# Patient Record
Sex: Male | Born: 1945 | Race: White | Hispanic: No | Marital: Married | Smoking: Never smoker
Health system: Southern US, Community
[De-identification: ages and names within clinical notes are randomized; demographics above are authoritative.]

## PROBLEM LIST (undated history)

## (undated) DIAGNOSIS — R110 Nausea: Secondary | ICD-10-CM

## (undated) DIAGNOSIS — I351 Nonrheumatic aortic (valve) insufficiency: Secondary | ICD-10-CM

## (undated) DIAGNOSIS — I7100 Dissection of unspecified site of aorta: Secondary | ICD-10-CM

## (undated) DIAGNOSIS — I7121 Aneurysm of the ascending aorta, without rupture: Secondary | ICD-10-CM

## (undated) DIAGNOSIS — I10 Essential (primary) hypertension: Secondary | ICD-10-CM

## (undated) DIAGNOSIS — R972 Elevated prostate specific antigen [PSA]: Secondary | ICD-10-CM

## (undated) DIAGNOSIS — R6889 Other general symptoms and signs: Secondary | ICD-10-CM

## (undated) DIAGNOSIS — E7800 Pure hypercholesterolemia, unspecified: Secondary | ICD-10-CM

## (undated) DIAGNOSIS — R42 Dizziness and giddiness: Secondary | ICD-10-CM

## (undated) DIAGNOSIS — I712 Thoracic aortic aneurysm, without rupture: Secondary | ICD-10-CM

## (undated) DIAGNOSIS — M109 Gout, unspecified: Secondary | ICD-10-CM

## (undated) HISTORY — DX: Gout, unspecified: M10.9

## (undated) HISTORY — DX: Other general symptoms and signs: R68.89

## (undated) HISTORY — DX: Nonrheumatic aortic (valve) insufficiency: I35.1

## (undated) HISTORY — DX: Dissection of unspecified site of aorta: I71.00

## (undated) HISTORY — DX: Pure hypercholesterolemia, unspecified: E78.00

## (undated) HISTORY — DX: Essential (primary) hypertension: I10

## (undated) HISTORY — DX: Dizziness and giddiness: R42

## (undated) HISTORY — DX: Thoracic aortic aneurysm, without rupture: I71.2

## (undated) HISTORY — DX: Aneurysm of the ascending aorta, without rupture: I71.21

## (undated) HISTORY — DX: Elevated prostate specific antigen (PSA): R97.20

## (undated) HISTORY — DX: Nausea: R11.0

---

## 2011-09-07 HISTORY — PX: BIOPSY PHARYNX: SUR141

## 2014-09-06 HISTORY — PX: HERNIA REPAIR: SHX51

## 2016-09-01 DIAGNOSIS — N529 Male erectile dysfunction, unspecified: Secondary | ICD-10-CM | POA: Diagnosis not present

## 2016-09-01 DIAGNOSIS — E785 Hyperlipidemia, unspecified: Secondary | ICD-10-CM | POA: Diagnosis not present

## 2016-09-01 DIAGNOSIS — I10 Essential (primary) hypertension: Secondary | ICD-10-CM | POA: Diagnosis not present

## 2016-09-01 DIAGNOSIS — M109 Gout, unspecified: Secondary | ICD-10-CM | POA: Diagnosis not present

## 2017-03-01 DIAGNOSIS — I10 Essential (primary) hypertension: Secondary | ICD-10-CM | POA: Diagnosis not present

## 2017-03-01 DIAGNOSIS — Z23 Encounter for immunization: Secondary | ICD-10-CM | POA: Diagnosis not present

## 2017-03-01 DIAGNOSIS — R5383 Other fatigue: Secondary | ICD-10-CM | POA: Diagnosis not present

## 2017-03-01 DIAGNOSIS — Z0000 Encounter for general adult medical examination without abnormal findings: Secondary | ICD-10-CM | POA: Diagnosis not present

## 2017-03-01 DIAGNOSIS — E785 Hyperlipidemia, unspecified: Secondary | ICD-10-CM | POA: Diagnosis not present

## 2017-03-04 DIAGNOSIS — E785 Hyperlipidemia, unspecified: Secondary | ICD-10-CM | POA: Diagnosis not present

## 2017-03-04 DIAGNOSIS — Z0000 Encounter for general adult medical examination without abnormal findings: Secondary | ICD-10-CM | POA: Diagnosis not present

## 2017-03-04 DIAGNOSIS — R5383 Other fatigue: Secondary | ICD-10-CM | POA: Diagnosis not present

## 2017-03-04 DIAGNOSIS — Z125 Encounter for screening for malignant neoplasm of prostate: Secondary | ICD-10-CM | POA: Diagnosis not present

## 2017-03-04 DIAGNOSIS — I10 Essential (primary) hypertension: Secondary | ICD-10-CM | POA: Diagnosis not present

## 2017-04-14 DIAGNOSIS — R972 Elevated prostate specific antigen [PSA]: Secondary | ICD-10-CM | POA: Diagnosis not present

## 2017-06-07 DIAGNOSIS — R972 Elevated prostate specific antigen [PSA]: Secondary | ICD-10-CM | POA: Diagnosis not present

## 2017-06-13 DIAGNOSIS — Z23 Encounter for immunization: Secondary | ICD-10-CM | POA: Diagnosis not present

## 2017-10-31 DIAGNOSIS — R69 Illness, unspecified: Secondary | ICD-10-CM | POA: Diagnosis not present

## 2017-12-07 DIAGNOSIS — R69 Illness, unspecified: Secondary | ICD-10-CM | POA: Diagnosis not present

## 2018-03-03 DIAGNOSIS — Z125 Encounter for screening for malignant neoplasm of prostate: Secondary | ICD-10-CM | POA: Diagnosis not present

## 2018-03-03 DIAGNOSIS — Z1211 Encounter for screening for malignant neoplasm of colon: Secondary | ICD-10-CM | POA: Diagnosis not present

## 2018-03-03 DIAGNOSIS — G629 Polyneuropathy, unspecified: Secondary | ICD-10-CM | POA: Diagnosis not present

## 2018-03-03 DIAGNOSIS — I10 Essential (primary) hypertension: Secondary | ICD-10-CM | POA: Diagnosis not present

## 2018-03-03 DIAGNOSIS — N529 Male erectile dysfunction, unspecified: Secondary | ICD-10-CM | POA: Diagnosis not present

## 2018-03-03 DIAGNOSIS — R972 Elevated prostate specific antigen [PSA]: Secondary | ICD-10-CM | POA: Diagnosis not present

## 2018-03-03 DIAGNOSIS — E785 Hyperlipidemia, unspecified: Secondary | ICD-10-CM | POA: Diagnosis not present

## 2018-03-03 DIAGNOSIS — Z0000 Encounter for general adult medical examination without abnormal findings: Secondary | ICD-10-CM | POA: Diagnosis not present

## 2018-04-27 DIAGNOSIS — R69 Illness, unspecified: Secondary | ICD-10-CM | POA: Diagnosis not present

## 2018-05-19 DIAGNOSIS — G8929 Other chronic pain: Secondary | ICD-10-CM | POA: Diagnosis not present

## 2018-05-19 DIAGNOSIS — G629 Polyneuropathy, unspecified: Secondary | ICD-10-CM | POA: Diagnosis not present

## 2018-05-19 DIAGNOSIS — K08409 Partial loss of teeth, unspecified cause, unspecified class: Secondary | ICD-10-CM | POA: Diagnosis not present

## 2018-05-19 DIAGNOSIS — N529 Male erectile dysfunction, unspecified: Secondary | ICD-10-CM | POA: Diagnosis not present

## 2018-05-19 DIAGNOSIS — I10 Essential (primary) hypertension: Secondary | ICD-10-CM | POA: Diagnosis not present

## 2018-05-19 DIAGNOSIS — E785 Hyperlipidemia, unspecified: Secondary | ICD-10-CM | POA: Diagnosis not present

## 2018-05-19 DIAGNOSIS — Z8249 Family history of ischemic heart disease and other diseases of the circulatory system: Secondary | ICD-10-CM | POA: Diagnosis not present

## 2018-06-13 DIAGNOSIS — R69 Illness, unspecified: Secondary | ICD-10-CM | POA: Diagnosis not present

## 2018-06-13 DIAGNOSIS — R972 Elevated prostate specific antigen [PSA]: Secondary | ICD-10-CM | POA: Diagnosis not present

## 2018-06-15 DIAGNOSIS — R972 Elevated prostate specific antigen [PSA]: Secondary | ICD-10-CM | POA: Diagnosis not present

## 2018-09-12 DIAGNOSIS — E785 Hyperlipidemia, unspecified: Secondary | ICD-10-CM | POA: Diagnosis not present

## 2018-09-12 DIAGNOSIS — R972 Elevated prostate specific antigen [PSA]: Secondary | ICD-10-CM | POA: Diagnosis not present

## 2018-09-12 DIAGNOSIS — I10 Essential (primary) hypertension: Secondary | ICD-10-CM | POA: Diagnosis not present

## 2018-09-12 DIAGNOSIS — G629 Polyneuropathy, unspecified: Secondary | ICD-10-CM | POA: Diagnosis not present

## 2019-02-16 DIAGNOSIS — M109 Gout, unspecified: Secondary | ICD-10-CM | POA: Diagnosis not present

## 2019-02-16 DIAGNOSIS — M1A0790 Idiopathic chronic gout, unspecified ankle and foot, without tophus (tophi): Secondary | ICD-10-CM | POA: Diagnosis not present

## 2019-02-27 DIAGNOSIS — R69 Illness, unspecified: Secondary | ICD-10-CM | POA: Diagnosis not present

## 2019-03-16 DIAGNOSIS — Z0000 Encounter for general adult medical examination without abnormal findings: Secondary | ICD-10-CM | POA: Diagnosis not present

## 2019-03-16 DIAGNOSIS — R972 Elevated prostate specific antigen [PSA]: Secondary | ICD-10-CM | POA: Diagnosis not present

## 2019-03-16 DIAGNOSIS — R05 Cough: Secondary | ICD-10-CM | POA: Diagnosis not present

## 2019-03-16 DIAGNOSIS — E785 Hyperlipidemia, unspecified: Secondary | ICD-10-CM | POA: Diagnosis not present

## 2019-03-16 DIAGNOSIS — I10 Essential (primary) hypertension: Secondary | ICD-10-CM | POA: Diagnosis not present

## 2019-03-16 DIAGNOSIS — Z1211 Encounter for screening for malignant neoplasm of colon: Secondary | ICD-10-CM | POA: Diagnosis not present

## 2019-03-16 DIAGNOSIS — M109 Gout, unspecified: Secondary | ICD-10-CM | POA: Diagnosis not present

## 2019-03-16 DIAGNOSIS — G629 Polyneuropathy, unspecified: Secondary | ICD-10-CM | POA: Diagnosis not present

## 2019-03-16 DIAGNOSIS — N529 Male erectile dysfunction, unspecified: Secondary | ICD-10-CM | POA: Diagnosis not present

## 2019-06-01 DIAGNOSIS — R69 Illness, unspecified: Secondary | ICD-10-CM | POA: Diagnosis not present

## 2019-11-21 DIAGNOSIS — Z20828 Contact with and (suspected) exposure to other viral communicable diseases: Secondary | ICD-10-CM | POA: Diagnosis not present

## 2019-11-23 DIAGNOSIS — I10 Essential (primary) hypertension: Secondary | ICD-10-CM | POA: Diagnosis not present

## 2019-11-23 DIAGNOSIS — M109 Gout, unspecified: Secondary | ICD-10-CM | POA: Diagnosis not present

## 2019-11-23 DIAGNOSIS — G629 Polyneuropathy, unspecified: Secondary | ICD-10-CM | POA: Diagnosis not present

## 2019-11-23 DIAGNOSIS — Z1211 Encounter for screening for malignant neoplasm of colon: Secondary | ICD-10-CM | POA: Diagnosis not present

## 2019-11-23 DIAGNOSIS — N529 Male erectile dysfunction, unspecified: Secondary | ICD-10-CM | POA: Diagnosis not present

## 2019-11-23 DIAGNOSIS — Z125 Encounter for screening for malignant neoplasm of prostate: Secondary | ICD-10-CM | POA: Diagnosis not present

## 2019-11-23 DIAGNOSIS — E785 Hyperlipidemia, unspecified: Secondary | ICD-10-CM | POA: Diagnosis not present

## 2019-11-23 DIAGNOSIS — R972 Elevated prostate specific antigen [PSA]: Secondary | ICD-10-CM | POA: Diagnosis not present

## 2019-12-05 DIAGNOSIS — Z20828 Contact with and (suspected) exposure to other viral communicable diseases: Secondary | ICD-10-CM | POA: Diagnosis not present

## 2019-12-26 DIAGNOSIS — Z20828 Contact with and (suspected) exposure to other viral communicable diseases: Secondary | ICD-10-CM | POA: Diagnosis not present

## 2020-01-16 DIAGNOSIS — Z20828 Contact with and (suspected) exposure to other viral communicable diseases: Secondary | ICD-10-CM | POA: Diagnosis not present

## 2020-02-06 DIAGNOSIS — Z20828 Contact with and (suspected) exposure to other viral communicable diseases: Secondary | ICD-10-CM | POA: Diagnosis not present

## 2020-04-28 DIAGNOSIS — R42 Dizziness and giddiness: Secondary | ICD-10-CM | POA: Diagnosis not present

## 2020-05-16 ENCOUNTER — Ambulatory Visit: Payer: Self-pay | Admitting: Internal Medicine

## 2020-05-22 NOTE — Progress Notes (Signed)
Electrophysiology Office Note:    Date:  05/23/2020   ID:  Kristopher Flores, DOB October 19, 1945, MRN [id number]  PCP:  Soundra Pilon, FNP  [facility] Cardiologist:  No primary care provider on file.  [facility] Electrophysiologist:  None   Referring MD: Jarrett Soho, PA-C   Chief Complaint: Dizziness  History of Present Illness:    Kristopher Flores is a 74 y.o. male who presents for an evaluation of dizziness at the request of Jarrett Soho, PA-C. Their medical history includes HTN, HLD, gout. He saw Jarrett Soho on 04/28/2020 in clinic. At that appointment he reported an episode of nausea, lightheadedness and cold sweats 4 days prior. The symptoms came on abruptly and lasted for several hours before subsiding.   He is fairly active. 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 He plays golf.  He tells me he had a sleep study in the past and was told he had sleep apnea (15-20 years ago) but did not tolerate the CPAP because of the noise. He had surgery to "remove scar tissue" which improved his sleep apnea on a repeat test. He went years without snoring but this has recently recurred. He is interested in having another sleep study.  Past Medical History:  Diagnosis Date   Cold sweat    Elevated PSA    Gout    Hypercholesterolemia    Hypertension    Lightheaded    Nauseous       Current Medications: Current Meds  Medication Sig   allopurinol (ZYLOPRIM) 100 MG tablet Take 100 mg by mouth daily.   gabapentin (NEURONTIN) 300 MG capsule Take 300 mg by mouth daily.    lisinopril (ZESTRIL) 20 MG tablet Take 20 mg by mouth daily.   Multiple Vitamin (MULTIVITAMIN) tablet Take 1 tablet by mouth daily.   pravastatin (PRAVACHOL) 80 MG tablet Take 80 mg by mouth daily.   sildenafil (REVATIO) 20 MG tablet Take 20 mg by mouth 3 (three) times daily.     Allergies:   Patient has no known allergies.   Social History   Socioeconomic History   Marital status:  Single    Spouse name: Not on file   Number of children: Not on file   Years of education: Not on file   Highest education level: Not on file  Occupational History   Not on file  Tobacco Use   Smoking status: Never Smoker   Smokeless tobacco: Never Used  Substance and Sexual Activity   Alcohol use: Never   Drug use: Never   Sexual activity: Not on file  Other Topics Concern   Not on file  Social History Narrative   Not on file   Social Determinants of Health   Financial Resource Strain:    Difficulty of Paying Living Expenses: Not on file  Food Insecurity:    Worried About Running Out of Food in the Last Year: Not on file   Ran Out of Food in the Last Year: Not on file  Transportation Needs:    Lack of Transportation (Medical): Not on file   Lack of Transportation (Non-Medical): Not on file  Physical Activity:    Days of Exercise per Week: Not on file   Minutes of Exercise per Session: Not on file  Stress:    Feeling of Stress : Not on file  Social Connections:    Frequency of Communication with Friends and Family: Not on file   Frequency of Social Gatherings with Friends and  Family: Not on file   Attends Religious Services: Not on file   Active Member of Clubs or Organizations: Not on file   Attends Club or Organization Meetings: Not on file   Marital Status: Not on file     Family History: The patient's family history includes CVA in his father, maternal grandfather, maternal grandmother, and paternal grandfather; Osteoarthritis in his mother.  ROS:   Please see the history of present illness.    All other systems reviewed and are negative.  EKGs/Labs/Other Studies Reviewed:    The following studies were reviewed today: ECG, outside records  04/28/2020 ECG personally reviewed Sinus rhythm, no preexcitation or QT prolongation.   EKG:  The ekg ordered today demonstrates sinus rhythm.  Recent Labs: No results found for requested labs  within last 8760 hours.  Recent Lipid Panel No results found for: CHOL, TRIG, HDL, CHOLHDL, VLDL, LDLCALC, LDLDIRECT  Physical Exam:    VS:  Ht 5\' 10"  (1.778 m)    Wt 183 lb (83 kg)    SpO2 96%    BMI 26.26 kg/m     Wt Readings from Last 3 Encounters:  05/23/20 183 lb (83 kg)     GEN:  Well nourished, well developed in no acute distress HEENT: Normal NECK: No JVD; No carotid bruits LYMPHATICS: No lymphadenopathy CARDIAC: RRR, II/VI crescendo-decrescendo murmur loudest at the RUSB. No radiation to the carotids. No rubs, gallops RESPIRATORY:  Clear to auscultation without rales, wheezing or rhonchi  ABDOMEN: Soft, non-tender, non-distended MUSCULOSKELETAL:  No edema; No deformity  SKIN: Warm and dry NEUROLOGIC:  Alert and oriented x 3 PSYCHIATRIC:  Normal affect   ASSESSMENT:    1. Syncope and collapse   2. Hypertension, unspecified type   3. Aortic valve stenosis, etiology of cardiac valve disease unspecified    PLAN:    In order of problems listed above:  1. SyncopeDizziness/Nausea/Vomiting Episode Unclear what caused that episode. It is possible that the episode was an anginal equivalent although I dont think this is likely. He also is able to exert himself without any chest pain, etc. Will get a treadmill ECG to assess exercise capacity and look for any abnormal ECG findings suggestive of ischemia with exercise.  2. HTN  3. Abnormal cardiac exam, ? Aortic sclerosis His exam is suggestive of mild AS. Will obtain a surface echo to formally assess the AV.   Medication Adjustments/Labs and Tests Ordered: Current medicines are reviewed at length with the patient today.  Concerns regarding medicines are outlined above.  Orders Placed This Encounter  Procedures   Exercise Tolerance Test   EKG 12-Lead   ECHOCARDIOGRAM COMPLETE   No orders of the defined types were placed in this encounter.    Signed, 05/25/20, MD, [facility]  05/23/2020 10:02 AM      Electrophysiology [facility]

## 2020-05-23 ENCOUNTER — Encounter: Payer: Self-pay | Admitting: Cardiology

## 2020-05-23 ENCOUNTER — Ambulatory Visit: Payer: Medicare HMO | Admitting: Cardiology

## 2020-05-23 ENCOUNTER — Other Ambulatory Visit: Payer: Self-pay

## 2020-05-23 VITALS — BP 105/69 | HR 61 | Ht 70.0 in | Wt 183.0 lb

## 2020-05-23 DIAGNOSIS — R0683 Snoring: Secondary | ICD-10-CM

## 2020-05-23 DIAGNOSIS — R55 Syncope and collapse: Secondary | ICD-10-CM | POA: Diagnosis not present

## 2020-05-23 DIAGNOSIS — I10 Essential (primary) hypertension: Secondary | ICD-10-CM | POA: Diagnosis not present

## 2020-05-23 DIAGNOSIS — I350 Nonrheumatic aortic (valve) stenosis: Secondary | ICD-10-CM

## 2020-05-23 NOTE — Patient Instructions (Addendum)
Medication Instructions:  Your physician recommends that you continue on your current medications as directed. Please refer to the Current Medication list given to you today.  *If you need a refill on your cardiac medications before your next appointment, please call your pharmacy*  Lab Work: None ordered.  If you have labs (blood work) drawn today and your tests are completely normal, you will receive your results only by: Marland Kitchen MyChart Message (if you have MyChart) OR . A paper copy in the mail If you have any lab test that is abnormal or we need to change your treatment, we will call you to review the results.  Testing/Procedures: Your physician has requested that you have an echocardiogram. Echocardiography is a painless test that uses sound waves to create images of your heart. It provides your doctor with information about the size and shape of your heart and how well your heart's chambers and valves are working. This procedure takes approximately one hour. There are no restrictions for this procedure.  Please schedule for ECHO  Your physician has requested that you have an exercise tolerance test.   Please schedule for treadmill stress test.  Your physician has recommended that you have a sleep study. This test records several body functions during sleep, including: brain activity, eye movement, oxygen and carbon dioxide blood levels, heart rate and rhythm, breathing rate and rhythm, the flow of air through your mouth and nose, snoring, body muscle movements, and chest and belly movement.  We will get this set up for you.  Follow-Up: At [facility], you and your health needs are our priority.  As part of our continuing mission to provide you with exceptional heart care, we have created designated Provider Care Teams.  These Care Teams include your primary Cardiologist (physician) and Advanced Practice Providers (APPs -  Physician Assistants and Nurse Practitioners) who all work  together to provide you with the care you need, when you need it.  We recommend signing up for the patient portal called "MyChart".  Sign up information is provided on this After Visit Summary.  MyChart is used to connect with patients for Virtual Visits (Telemedicine).  Patients are able to view lab/test results, encounter notes, upcoming appointments, etc.  Non-urgent messages can be sent to your provider as well.   To learn more about what you can do with MyChart, go to [email or web address].    Your next appointment:   Your physician wants you to follow-up in: 3 months with Dr. Lalla Brothers in person at the [facility] office.

## 2020-06-03 DIAGNOSIS — R69 Illness, unspecified: Secondary | ICD-10-CM | POA: Diagnosis not present

## 2020-06-06 ENCOUNTER — Other Ambulatory Visit (HOSPITAL_COMMUNITY)
Admission: RE | Admit: 2020-06-06 | Discharge: 2020-06-06 | Disposition: A | Discharge status: Home / Self Care | Payer: Medicare HMO | Source: Ambulatory Visit | Attending: Cardiology | Admitting: Cardiology

## 2020-06-06 DIAGNOSIS — Z01812 Encounter for preprocedural laboratory examination: Secondary | ICD-10-CM | POA: Insufficient documentation

## 2020-06-06 DIAGNOSIS — Z20822 Contact with and (suspected) exposure to covid-19: Secondary | ICD-10-CM | POA: Insufficient documentation

## 2020-06-06 LAB — SARS CORONAVIRUS 2 (TAT 6-24 HRS): SARS Coronavirus 2: NEGATIVE

## 2020-06-10 ENCOUNTER — Ambulatory Visit (INDEPENDENT_AMBULATORY_CARE_PROVIDER_SITE_OTHER): Payer: Medicare HMO

## 2020-06-10 ENCOUNTER — Encounter: Payer: Self-pay | Admitting: Cardiology

## 2020-06-10 ENCOUNTER — Other Ambulatory Visit: Payer: Self-pay

## 2020-06-10 ENCOUNTER — Ambulatory Visit (INDEPENDENT_AMBULATORY_CARE_PROVIDER_SITE_OTHER): Payer: Medicare HMO | Admitting: Cardiology

## 2020-06-10 ENCOUNTER — Ambulatory Visit (HOSPITAL_COMMUNITY): Payer: Medicare HMO | Attending: Cardiology

## 2020-06-10 VITALS — BP 126/72 | HR 54 | Ht 70.0 in | Wt 184.0 lb

## 2020-06-10 DIAGNOSIS — I77810 Thoracic aortic ectasia: Secondary | ICD-10-CM

## 2020-06-10 DIAGNOSIS — I10 Essential (primary) hypertension: Secondary | ICD-10-CM

## 2020-06-10 DIAGNOSIS — I350 Nonrheumatic aortic (valve) stenosis: Secondary | ICD-10-CM | POA: Diagnosis not present

## 2020-06-10 LAB — ECHOCARDIOGRAM COMPLETE
AR max vel: 2.38 cm2
AV Area VTI: 2.92 cm2
AV Area mean vel: 3.17 cm2
AV Mean grad: 3 mmHg
AV Peak grad: 6.8 mmHg
Ao pk vel: 1.3 m/s
Area-P 1/2: 2.01 cm2
P 1/2 time: 450 msec
S' Lateral: 2.6 cm

## 2020-06-10 NOTE — Progress Notes (Signed)
Electrophysiology Office Follow up Visit Note:    Date:  06/10/2020   ID:  Kristopher Flores, DOB May 11, 1946, MRN [id number]  PCP:  Soundra Pilon, FNP  [facility] Cardiologist:  No primary care provider on file.  [facility] Electrophysiologist:  None    Interval History:    Kristopher Flores is a 74 y.o. male who presents for a follow up visit. They were last seen in clinic May 23, 2020.  He was seen at that appointment for an episode of nausea, lightheadedness and cold sweats that occurred 4 days prior.  During that appointment there was a systolic ejection murmur noted at the aortic position and so an echocardiogram was ordered.  Today he presented for his echo which demonstrated a significantly dilated ascending aorta measuring greater than 5 cm.  There is some associated aortic insufficiency.  The aortic valve appears trileaflet. On further history today, the patient tells me that he has an extensive family history of deaths from "heart attacks".  He tells me that work-up was very limited for these events so it is unclear if they truly were heart attacks or some other cardiovascular cause.  No history of Marfan's.  No other history of aneurysms that he is aware of.   Past Medical History:  Diagnosis Date  . Cold sweat   . Elevated PSA   . Gout   . Hypercholesterolemia   . Hypertension   . Lightheaded   . Nauseous     Past Surgical History:  Procedure Laterality Date  . BIOPSY PHARYNX  2013  . HERNIA REPAIR  2016    Current Medications: Current Meds  Medication Sig  . allopurinol (ZYLOPRIM) 100 MG tablet Take 100 mg by mouth daily.  Marland Kitchen gabapentin (NEURONTIN) 300 MG capsule Take 300 mg by mouth daily.   Marland Kitchen lisinopril (ZESTRIL) 20 MG tablet Take 20 mg by mouth daily.  . Multiple Vitamin (MULTIVITAMIN) tablet Take 1 tablet by mouth daily.  . pravastatin (PRAVACHOL) 80 MG tablet Take 80 mg by mouth daily.  . sildenafil (REVATIO) 20 MG tablet Take 20 mg by mouth 3  (three) times daily.     Allergies:   Patient has no known allergies.   Social History   Socioeconomic History  . Marital status: Single    Spouse name: Not on file  . Number of children: Not on file  . Years of education: Not on file  . Highest education level: Not on file  Occupational History  . Not on file  Tobacco Use  . Smoking status: Never Smoker  . Smokeless tobacco: Never Used  Vaping Use  . Vaping Use: Never used  Substance and Sexual Activity  . Alcohol use: Never  . Drug use: Never  . Sexual activity: Not Currently  Other Topics Concern  . Not on file  Social History Narrative  . Not on file   Social Determinants of Health   Financial Resource Strain:   . Difficulty of Paying Living Expenses: Not on file  Food Insecurity:   . Worried About Programme researcher, broadcasting/film/video in the Last Year: Not on file  . Ran Out of Food in the Last Year: Not on file  Transportation Needs:   . Lack of Transportation (Medical): Not on file  . Lack of Transportation (Non-Medical): Not on file  Physical Activity:   . Days of Exercise per Week: Not on file  . Minutes of Exercise per Session: Not on file  Stress:   . Feeling  of Stress : Not on file  Social Connections:   . Frequency of Communication with Friends and Family: Not on file  . Frequency of Social Gatherings with Friends and Family: Not on file  . Attends Religious Services: Not on file  . Active Member of Clubs or Organizations: Not on file  . Attends Banker Meetings: Not on file  . Marital Status: Not on file     Family History: The patient's family history includes CVA in his father, maternal grandfather, maternal grandmother, and paternal grandfather; Osteoarthritis in his mother.  ROS:   Please see the history of present illness.    All other systems reviewed and are negative.  EKGs/Labs/Other Studies Reviewed:    The following studies were reviewed today: Echo  June 10, 2020 echocardiogram  personally reviewed Left ventricular function normal Significantly dilated ascending aorta (at least 5.3 cm in some views) Trileaflet aortic valve with aortic insufficiency    Recent Labs: No results found for requested labs within last 8760 hours.  Recent Lipid Panel No results found for: CHOL, TRIG, HDL, CHOLHDL, VLDL, LDLCALC, LDLDIRECT  Physical Exam:    VS:  BP 126/72   Pulse (!) 54   Ht 5\' 10"  (1.778 m)   Wt 184 lb (83.5 kg)   SpO2 97%   BMI 26.40 kg/m     Wt Readings from Last 3 Encounters:  06/10/20 184 lb (83.5 kg)  05/23/20 183 lb (83 kg)     GEN:  Well nourished, well developed in no acute distress HEENT: Normal NECK: No JVD; No carotid bruits LYMPHATICS: No lymphadenopathy CARDIAC: RRR, 3 out of 6 systolic ejection murmur at the left upper sternal border.   RESPIRATORY:  Clear to auscultation without rales, wheezing or rhonchi  ABDOMEN: Soft, non-tender, non-distended MUSCULOSKELETAL:  No edema; No deformity  SKIN: Warm and dry NEUROLOGIC:  Alert and oriented x 3 PSYCHIATRIC:  Normal affect   ASSESSMENT:    1. Ascending aorta dilatation (HCC)   2. Hypertension, unspecified type    PLAN:    In order of problems listed above:  1. Ascending aortic aneurysm Patient with an incidentally discovered ascending aortic aneurysm on echocardiogram today.  There is some associated aortic insufficiency.  Aortic valve appears tricuspid. We will plan to get a CT aortic protocol to better assess the aortic dimensions .  We will plan to see the patient back in 4 weeks to discuss results and next steps including possible referral to cardiothoracic surgery.  2.  Hypertension Controlled today  Medication Adjustments/Labs and Tests Ordered: Current medicines are reviewed at length with the patient today.  Concerns regarding medicines are outlined above.  Orders Placed This Encounter  Procedures  . CT ANGIO CHEST AORTA W/CM & OR WO/CM  . Basic Metabolic Panel (BMET)     No orders of the defined types were placed in this encounter.    Signed, 05/25/20, MD, [facility]  06/10/2020 4:01 PM    Electrophysiology [facility]

## 2020-06-10 NOTE — Patient Instructions (Addendum)
Medication Instructions:  Your physician recommends that you continue on your current medications as directed. Please refer to the Current Medication list given to you today. *If you need a refill on your cardiac medications before your next appointment, please call your pharmacy*  Lab Work: You will get lab work today:  BMP  If you have labs (blood work) drawn today and your tests are completely normal, you will receive your results only by: Marland Kitchen MyChart Message (if you have MyChart) OR . A paper copy in the mail If you have any lab test that is abnormal or we need to change your treatment, we will call you to review the results.  Testing/Procedures: You will be scheduled for a Chest CT to evaluate your aorta.  This is done at the [facility] office.  Follow-Up: At [facility], you and your health needs are our priority.  As part of our continuing mission to provide you with exceptional heart care, we have created designated Provider Care Teams.  These Care Teams include your primary Cardiologist (physician) and Advanced Practice Providers (APPs -  Physician Assistants and Nurse Practitioners) who all work together to provide you with the care you need, when you need it.  We recommend signing up for the patient portal called "MyChart".  Sign up information is provided on this After Visit Summary.  MyChart is used to connect with patients for Virtual Visits (Telemedicine).  Patients are able to view lab/test results, encounter notes, upcoming appointments, etc.  Non-urgent messages can be sent to your provider as well.   To learn more about what you can do with MyChart, go to [email or web address].    Your next appointment:   Your physician wants you to follow-up in:  July 08, 2020 at 9:30 am at the [facility] office

## 2020-06-11 LAB — BASIC METABOLIC PANEL
BUN/Creatinine Ratio: 18 (ref 10–24)
BUN: 18 mg/dL (ref 8–27)
CO2: 27 mmol/L (ref 20–29)
Calcium: 9.7 mg/dL (ref 8.6–10.2)
Chloride: 99 mmol/L (ref 96–106)
Creatinine, Ser: 1.01 mg/dL (ref 0.76–1.27)
GFR calc Af Amer: 84 mL/min/{1.73_m2} (ref >59)
GFR calc non Af Amer: 73 mL/min/{1.73_m2} (ref >59)
Glucose: 79 mg/dL (ref 65–99)
Potassium: 4.4 mmol/L (ref 3.5–5.2)
Sodium: 139 mmol/L (ref 134–144)

## 2020-06-12 ENCOUNTER — Ambulatory Visit: Payer: Self-pay | Admitting: Internal Medicine

## 2020-06-27 ENCOUNTER — Other Ambulatory Visit: Payer: Self-pay

## 2020-06-27 ENCOUNTER — Ambulatory Visit (INDEPENDENT_AMBULATORY_CARE_PROVIDER_SITE_OTHER)
Admission: RE | Admit: 2020-06-27 | Discharge: 2020-06-27 | Disposition: A | Discharge status: Home / Self Care | Payer: Medicare HMO | Source: Ambulatory Visit | Attending: Cardiology | Admitting: Cardiology

## 2020-06-27 DIAGNOSIS — R911 Solitary pulmonary nodule: Secondary | ICD-10-CM | POA: Diagnosis not present

## 2020-06-27 DIAGNOSIS — I77810 Thoracic aortic ectasia: Secondary | ICD-10-CM | POA: Diagnosis not present

## 2020-06-27 MED ORDER — IOHEXOL 350 MG/ML SOLN
100.0000 mL | Freq: Once | INTRAVENOUS | Status: AC | PRN
  Administered 2020-06-27: 100 mL via INTRAVENOUS

## 2020-07-01 ENCOUNTER — Encounter (INDEPENDENT_AMBULATORY_CARE_PROVIDER_SITE_OTHER): Payer: Medicare HMO | Admitting: Cardiology

## 2020-07-01 DIAGNOSIS — G4733 Obstructive sleep apnea (adult) (pediatric): Secondary | ICD-10-CM

## 2020-07-01 DIAGNOSIS — R0683 Snoring: Secondary | ICD-10-CM | POA: Diagnosis not present

## 2020-07-08 ENCOUNTER — Encounter: Payer: Self-pay | Admitting: Cardiology

## 2020-07-08 ENCOUNTER — Other Ambulatory Visit: Payer: Self-pay

## 2020-07-08 ENCOUNTER — Ambulatory Visit: Payer: Medicare HMO | Admitting: Cardiology

## 2020-07-08 VITALS — BP 126/80 | HR 71 | Ht 70.0 in | Wt 188.8 lb

## 2020-07-08 DIAGNOSIS — I10 Essential (primary) hypertension: Secondary | ICD-10-CM | POA: Diagnosis not present

## 2020-07-08 DIAGNOSIS — I77810 Thoracic aortic ectasia: Secondary | ICD-10-CM

## 2020-07-08 NOTE — Progress Notes (Signed)
Electrophysiology Office Follow up Visit Note:    Date:  07/08/2020   ID:  Kristopher Flores, DOB 1945-12-15, MRN [id number]  PCP:  Kristopher Pilon, FNP  [facility] Cardiologist:  No primary care provider on file.  [facility] Electrophysiologist:  None    Interval History:    Kristopher Flores is a 74 y.o. male who presents for a follow up visit. They were last seen in clinic June 10, 2020 for his ascending aortic aneurysm.  This aneurysm was incidentally noted during an echocardiogram.  A CT scan was ordered and has been completed since the last visit. He tells me he has been doing well.  He continues to golf.  Past Medical History:  Diagnosis Date  . Cold sweat   . Elevated PSA   . Gout   . Hypercholesterolemia   . Hypertension   . Lightheaded   . Nauseous     Past Surgical History:  Procedure Laterality Date  . BIOPSY PHARYNX  2013  . HERNIA REPAIR  2016    Current Medications: Current Meds  Medication Sig  . allopurinol (ZYLOPRIM) 100 MG tablet Take 100 mg by mouth daily.  Marland Kitchen gabapentin (NEURONTIN) 300 MG capsule Take 300 mg by mouth daily.   Marland Kitchen lisinopril (ZESTRIL) 20 MG tablet Take 20 mg by mouth daily.  . Multiple Vitamin (MULTIVITAMIN) tablet Take 1 tablet by mouth daily.  . pravastatin (PRAVACHOL) 80 MG tablet Take 80 mg by mouth daily.  . sildenafil (REVATIO) 20 MG tablet Take 20 mg by mouth as needed.     Allergies:   Patient has no known allergies.   Social History   Socioeconomic History  . Marital status: Single    Spouse name: Not on file  . Number of children: Not on file  . Years of education: Not on file  . Highest education level: Not on file  Occupational History  . Not on file  Tobacco Use  . Smoking status: Never Smoker  . Smokeless tobacco: Never Used  Vaping Use  . Vaping Use: Never used  Substance and Sexual Activity  . Alcohol use: Never  . Drug use: Never  . Sexual activity: Not Currently  Other Topics Concern  . Not on  file  Social History Narrative  . Not on file   Social Determinants of Health   Financial Resource Strain:   . Difficulty of Paying Living Expenses: Not on file  Food Insecurity:   . Worried About Programme researcher, broadcasting/film/video in the Last Year: Not on file  . Ran Out of Food in the Last Year: Not on file  Transportation Needs:   . Lack of Transportation (Medical): Not on file  . Lack of Transportation (Non-Medical): Not on file  Physical Activity:   . Days of Exercise per Week: Not on file  . Minutes of Exercise per Session: Not on file  Stress:   . Feeling of Stress : Not on file  Social Connections:   . Frequency of Communication with Friends and Family: Not on file  . Frequency of Social Gatherings with Friends and Family: Not on file  . Attends Religious Services: Not on file  . Active Member of Clubs or Organizations: Not on file  . Attends Banker Meetings: Not on file  . Marital Status: Not on file     Family History: The patient's family history includes CVA in his father, maternal grandfather, maternal grandmother, and paternal grandfather; Osteoarthritis in his mother.  ROS:  Please see the history of present illness.    All other systems reviewed and are negative.  EKGs/Labs/Other Studies Reviewed:    The following studies were reviewed today: CT chest  June 27, 2020 CTA aorta personally reviewed Ascending thoracic aorta measures 5.1 cm in greatest diameter Small left lower lobe nodules-62-month follow-up CT recommended   Recent Labs: 06/10/2020: BUN 18; Creatinine, Ser 1.01; Potassium 4.4; Sodium 139  Recent Lipid Panel No results found for: CHOL, TRIG, HDL, CHOLHDL, VLDL, LDLCALC, LDLDIRECT  Physical Exam:    VS:  BP 126/80   Pulse 71   Ht 5\' 10"  (1.778 m)   Wt 188 lb 12.8 oz (85.6 kg)   SpO2 97%   BMI 27.09 kg/m     Wt Readings from Last 3 Encounters:  07/08/20 188 lb 12.8 oz (85.6 kg)  06/10/20 184 lb (83.5 kg)  05/23/20 183 lb (83  kg)     GEN:  Well nourished, well developed in no acute distress HEENT: Normal NECK: No JVD; No carotid bruits LYMPHATICS: No lymphadenopathy CARDIAC: RRR, no murmurs, rubs, gallops RESPIRATORY:  Clear to auscultation without rales, wheezing or rhonchi  ABDOMEN: Soft, non-tender, non-distended MUSCULOSKELETAL:  No edema; No deformity  SKIN: Warm and dry NEUROLOGIC:  Alert and oriented x 3 PSYCHIATRIC:  Normal affect   ASSESSMENT:    1. Ascending aorta dilatation (HCC)   2. Hypertension, unspecified type    PLAN:    In order of problems listed above:  1. Ascending aortic aneurysm 51 mm in diameter by CT.  52 mm by echo. We will need to see him back in 6 months with repeat imaging at that time to assess for stability of the acing aorta.  He had some pulmonary nodules on his recent CT several to the initial 19-month follow-up imaging with a CTA aorta protocol to assess both the aorta and these nodules.  If that scan looks good can do the next follow-up imaging with echo given the concordance in measurement between CT and echo. Plan for impulse control in the meantime.  2.  Hypertension Continue lisinopril    Medication Adjustments/Labs and Tests Ordered: Current medicines are reviewed at length with the patient today.  Concerns regarding medicines are outlined above.  Orders Placed This Encounter  Procedures  . CT ANGIO CHEST AORTA W/CM & OR WO/CM   No orders of the defined types were placed in this encounter.    Signed, 8-month, MD, [facility]  07/08/2020 9:51 AM    Electrophysiology [facility]

## 2020-07-08 NOTE — Patient Instructions (Addendum)
Medication Instructions:  Your physician recommends that you continue on your current medications as directed. Please refer to the Current Medication list given to you today. *If you need a refill on your cardiac medications before your next appointment, please call your pharmacy*  Lab Work: None ordered. If you have labs (blood work) drawn today and your tests are completely normal, you will receive your results only by: Marland Kitchen MyChart Message (if you have MyChart) OR . A paper copy in the mail If you have any lab test that is abnormal or we need to change your treatment, we will call you to review the results.  Testing/Procedures: Chest CT aorta  Will repeat in 6 months  Follow-Up: At [facility], you and your health needs are our priority.  As part of our continuing mission to provide you with exceptional heart care, we have created designated Provider Care Teams.  These Care Teams include your primary Cardiologist (physician) and Advanced Practice Providers (APPs -  Physician Assistants and Nurse Practitioners) who all work together to provide you with the care you need, when you need it.  Your next appointment:   Your physician wants you to follow-up in: 6 months with Dr. Lalla Brothers.   You will receive a reminder letter in the mail two months in advance. If you don't receive a letter, please call our office to schedule the follow-up appointment.

## 2020-07-20 ENCOUNTER — Ambulatory Visit: Payer: Medicare HMO

## 2020-07-20 DIAGNOSIS — R0683 Snoring: Secondary | ICD-10-CM

## 2020-07-20 NOTE — Procedures (Signed)
    Sleep Study Report  Patient Information  Name: Kristopher Flores  ID: [id number] Birth Date: Sep 09, 1945  Age: 74  Gender: Male Study Date:07/01/2020 Referring Physician:    TEST DESCRIPTION: Home sleep apnea testing was completed using the WatchPat, a Type 1 device, utilizing peripheral arterial tonometry (PAT), chest movement, actigraphy, pulse oximetry, pulse rate, body position and snore. AHI was calculated with apnea and hypopnea using valid sleep time as the denominator. RDI includes apneas, hypopneas, and RERAs. The data acquired and the scoring of sleep and all associated events were performed in accordance with the recommended standards and specifications as outlined in the AASM Manual for the Scoring of  Sleep and Associated Events 2.2.0 (2015).   FINDINGS: 1. No evidence of Obstructive Sleep Apnea with AHI 4.4/hr.  2. No Central Sleep Apnea. 3. Oxygen desaturations as low as 86%. 4. Mild snoring was present. O2 sats were < 88% for 1.69minutes. 5. Total sleep time was 7 hrs and . 6. 21.5% of total sleep time was spent in the supine position.  7. Normal sleep onset latency at 20 min.  8. Short REM sleep onset latency at 55 min.  9. Total awakenings were 3.   DIAGNOSIS:  Normal study with no significant sleep disordered breathing.  Recommendations  1. Normal study with no significant sleep disordered breathing.  2. Healthy sleep recommendations include: adequate nightly sleep (normal 7-9 hrs/night), avoidance of caffeine afternoon and alcohol near bedtime, and maintaining a sleep environment that is cool, dark and quiet.  3. Weight loss for overweight patients is recommended.   4. Snoring recommendations include: weight loss where appropriate, side sleeping, and avoidance of alcohol before bed.  5. Operation of motor vehicle or dangerous equipment must be avoided when feeling drowsy, excessively sleepy, or  mentally fatigued.    2. An ENT consultation which may be  useful for specific causes of and possible treatment of bothersome snoring .   3. Weight loss may be of benefit in reducing the severity of snoring.   Report prepared by: Signature: Armanda Magic, MD [facility], Diplomat ABSM  Electronically Signed: Jul 20, 2020

## 2020-07-22 ENCOUNTER — Telehealth: Payer: Self-pay | Admitting: *Deleted

## 2020-07-22 NOTE — Telephone Encounter (Signed)
-----   Message from Quintella Reichert, MD sent at 07/20/2020  4:07 PM EST ----- Please let patient know that sleep study showed no significant sleep apnea.

## 2020-07-22 NOTE — Telephone Encounter (Signed)
Informed patient of sleep study results and patient understanding was verbalized. Patient understands her sleep study showed no significant sleep apnea.    Pt is aware of normal results.  PER DPR Left detailed message on voicemail and informed patient to call back with questions

## 2020-08-04 DIAGNOSIS — N138 Other obstructive and reflux uropathy: Secondary | ICD-10-CM | POA: Diagnosis not present

## 2020-08-04 DIAGNOSIS — N401 Enlarged prostate with lower urinary tract symptoms: Secondary | ICD-10-CM | POA: Diagnosis not present

## 2020-08-04 DIAGNOSIS — R972 Elevated prostate specific antigen [PSA]: Secondary | ICD-10-CM | POA: Diagnosis not present

## 2020-08-26 ENCOUNTER — Telehealth: Payer: Self-pay | Admitting: Cardiology

## 2020-08-26 ENCOUNTER — Ambulatory Visit: Payer: Medicare HMO | Admitting: Cardiology

## 2020-08-26 NOTE — Telephone Encounter (Signed)
Spoke with pt who is reporting last night he noted a "pain above his left breast" that he is concerned is related to his recent diagnosis of enlarged aorta seen on CT (10/22021).  He reports this pain feels like a strain, almost like a bruise without radiation or SOB.  He can feel it more with a deep breathe but not with movement of his arms.  It is a 3-4 on a pain scale of 1-10.  He had the discomfort last night, slept without difficulty but still feels it today. He has taken Ibuprofen but hasn't noticed any improvement.  He denies any SOB,radiation,n/v or diaphoresis.   CT result does note: Coronary atherosclerosis with calcified coronary artery plaque in a 3 vessel distribution.  No further evaluation of this is noted in the chart.  The ascending thoracic aorta shows significant dilatation and measures approximately 5.1 cm in greatest diameter. - orders have been placed for repeat CT angio chest aorta for 12/2020.  Reassurance given.  Advised pt I will send this information to Dr Lalla Brothers and his nurse for review and further eval/treatment.  He will await a return call for further instructions or call back prior to s/s worsen.

## 2020-08-26 NOTE — Telephone Encounter (Signed)
Pt c/o of Chest Pain: STAT if CP now or developed within 24 hours  1. Are you having CP right now? Yes. Pain above left breast.  2. Are you experiencing any other symptoms (ex. SOB, nausea, vomiting, sweating)? No.  3. How long have you been experiencing CP?  Since last night.  4. Is your CP continuous or coming and going? Continuous.  5. Have you taken Nitroglycerin? No. ?

## 2020-08-26 NOTE — Telephone Encounter (Signed)
Returned call to Pt and reassured him that his symptoms did not seem life threatening per review by Dr. Lalla Brothers.  Pt thanked nurse for call.

## 2020-09-05 DIAGNOSIS — U071 COVID-19: Secondary | ICD-10-CM | POA: Diagnosis not present

## 2020-10-21 ENCOUNTER — Other Ambulatory Visit: Payer: Self-pay | Admitting: *Deleted

## 2020-10-21 DIAGNOSIS — I77810 Thoracic aortic ectasia: Secondary | ICD-10-CM

## 2020-12-15 DIAGNOSIS — Z0100 Encounter for examination of eyes and vision without abnormal findings: Secondary | ICD-10-CM | POA: Diagnosis not present

## 2020-12-15 DIAGNOSIS — H5203 Hypermetropia, bilateral: Secondary | ICD-10-CM | POA: Diagnosis not present

## 2020-12-15 DIAGNOSIS — H524 Presbyopia: Secondary | ICD-10-CM | POA: Diagnosis not present

## 2020-12-15 DIAGNOSIS — H52209 Unspecified astigmatism, unspecified eye: Secondary | ICD-10-CM | POA: Diagnosis not present

## 2020-12-24 ENCOUNTER — Other Ambulatory Visit: Payer: Medicare HMO | Admitting: *Deleted

## 2020-12-24 ENCOUNTER — Other Ambulatory Visit: Payer: Self-pay

## 2020-12-24 DIAGNOSIS — I77810 Thoracic aortic ectasia: Secondary | ICD-10-CM | POA: Diagnosis not present

## 2020-12-24 LAB — BASIC METABOLIC PANEL
BUN/Creatinine Ratio: 21 (ref 10–24)
BUN: 23 mg/dL (ref 8–27)
CO2: 24 mmol/L (ref 20–29)
Calcium: 9.7 mg/dL (ref 8.6–10.2)
Chloride: 101 mmol/L (ref 96–106)
Creatinine, Ser: 1.08 mg/dL (ref 0.76–1.27)
Glucose: 84 mg/dL (ref 65–99)
Potassium: 4.5 mmol/L (ref 3.5–5.2)
Sodium: 140 mmol/L (ref 134–144)
eGFR: 72 mL/min/{1.73_m2} (ref >59)

## 2020-12-29 ENCOUNTER — Other Ambulatory Visit: Payer: Self-pay

## 2020-12-29 ENCOUNTER — Ambulatory Visit (INDEPENDENT_AMBULATORY_CARE_PROVIDER_SITE_OTHER)
Admission: RE | Admit: 2020-12-29 | Discharge: 2020-12-29 | Disposition: A | Discharge status: Home / Self Care | Payer: Medicare HMO | Source: Ambulatory Visit | Attending: Cardiology | Admitting: Cardiology

## 2020-12-29 DIAGNOSIS — E041 Nontoxic single thyroid nodule: Secondary | ICD-10-CM | POA: Diagnosis not present

## 2020-12-29 DIAGNOSIS — I712 Thoracic aortic aneurysm, without rupture: Secondary | ICD-10-CM | POA: Diagnosis not present

## 2020-12-29 DIAGNOSIS — R911 Solitary pulmonary nodule: Secondary | ICD-10-CM | POA: Diagnosis not present

## 2020-12-29 DIAGNOSIS — I77810 Thoracic aortic ectasia: Secondary | ICD-10-CM | POA: Diagnosis not present

## 2020-12-29 IMAGING — CT CT ANGIO CHEST
3 of 8 series · 18 of 46 positions shown · IV contrast (OMNIPAQUE 350)
Comparison: Prior CTA chest 06/27/2020

CLINICAL DATA: Ascending thoracic aortic aneurysm

EXAM:
CT ANGIOGRAPHY CHEST WITH CONTRAST
TECHNIQUE: Multidetector CT imaging of the chest was performed using the
standard protocol during bolus administration of intravenous
contrast. Multiplanar CT image reconstructions and MIPs were
obtained to evaluate the vascular anatomy.
CONTRAST:  100mL OMNIPAQUE IOHEXOL 350 MG/ML SOLN

[Series 4: aorta 3.0 bf37 2 · axial · 0.75mm/px · z∈[-334,-34]mm · 13 of 118 slices shown]
[im 9/118  lung]
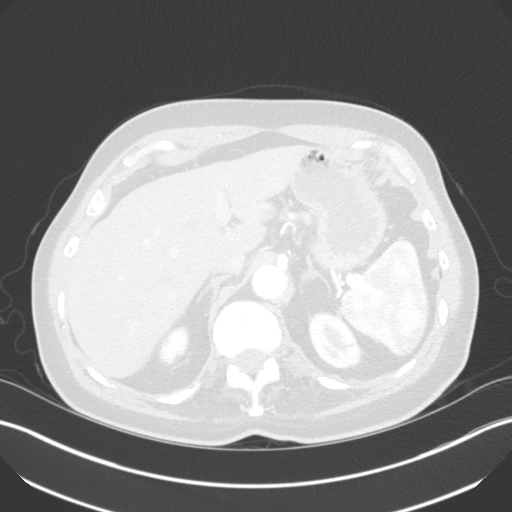
[im 17/118  soft-tissue]
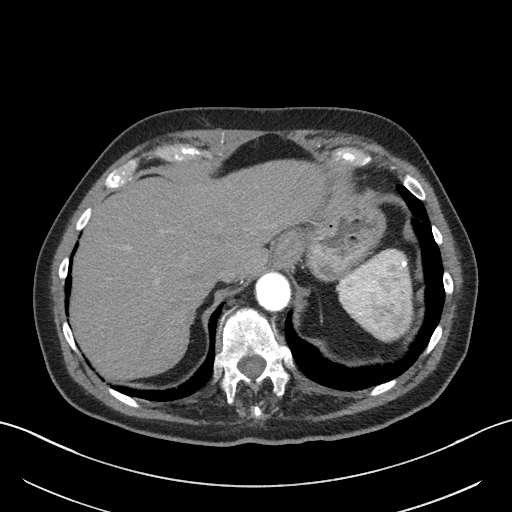
[im 26/118  lung]
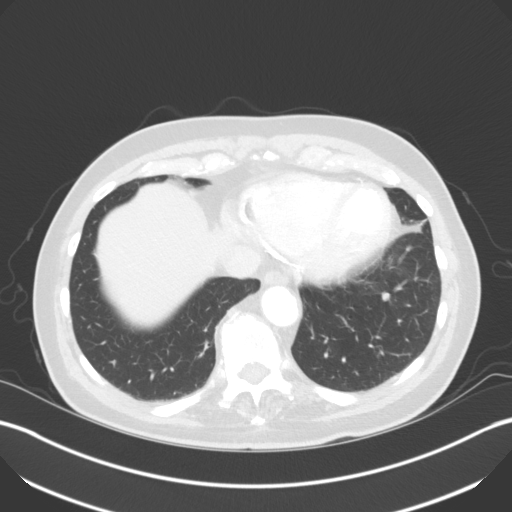
[im 34/118  soft-tissue]
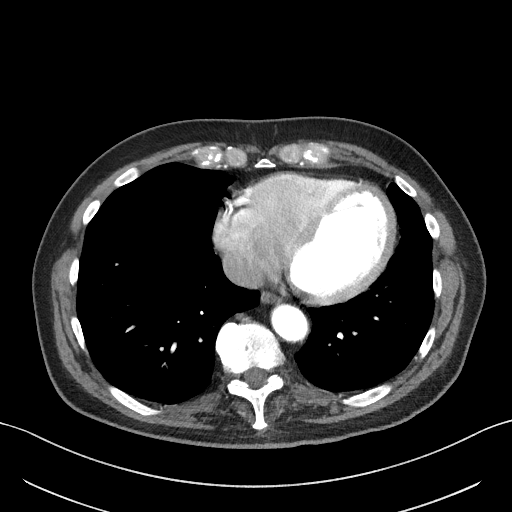
[im 42/118  lung]
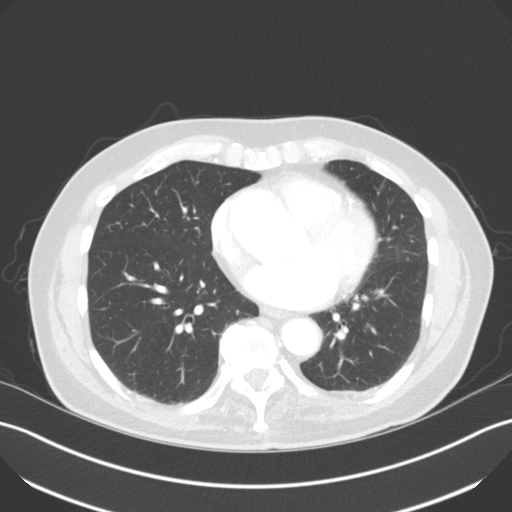
[im 51/118  soft-tissue]
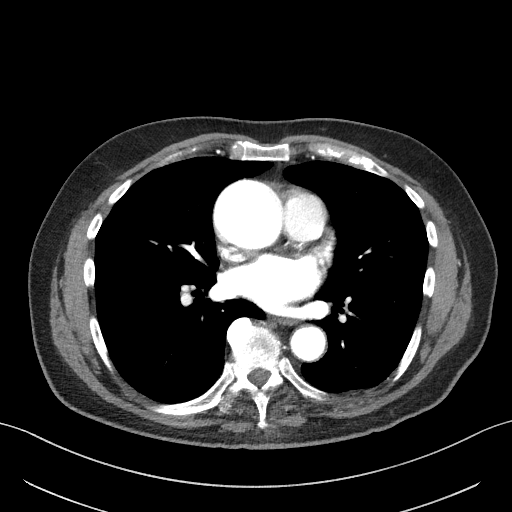
[im 59/118  lung]
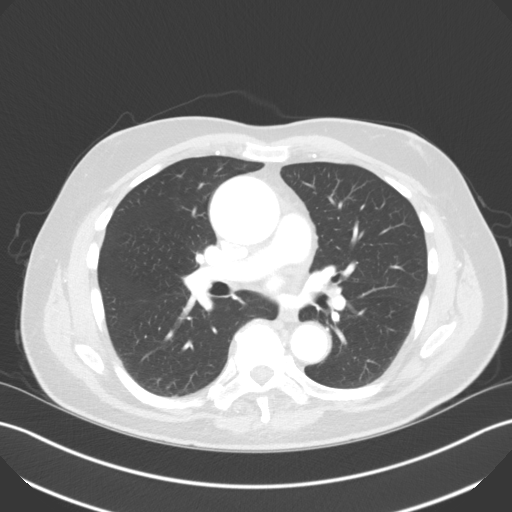
[im 67/118  soft-tissue]
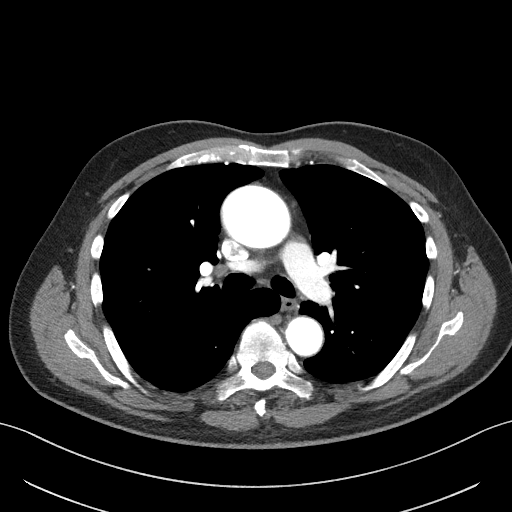
[im 76/118  lung]
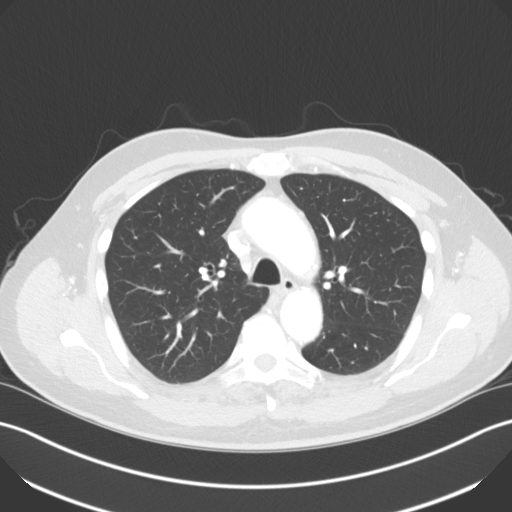
[im 84/118  soft-tissue]
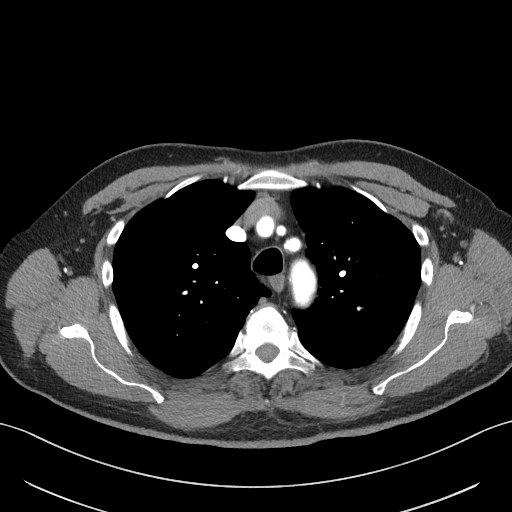
[im 92/118  lung]
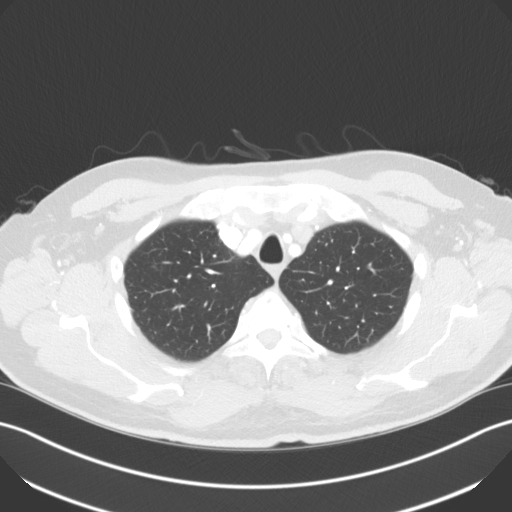
[im 101/118  soft-tissue]
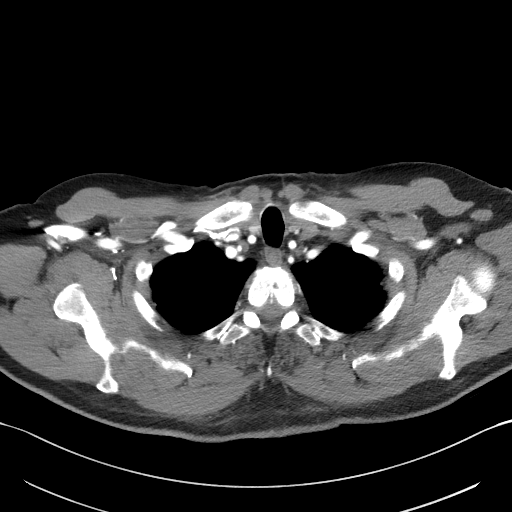
[im 109/118  lung]
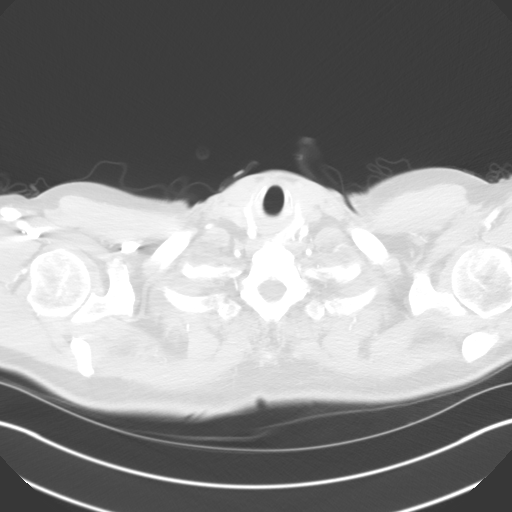

[Series 5: lung · axial · 0.75mm/px · z∈[-334,-284]mm · 2 of 118 slices shown]
[im 9/118  soft-tissue]
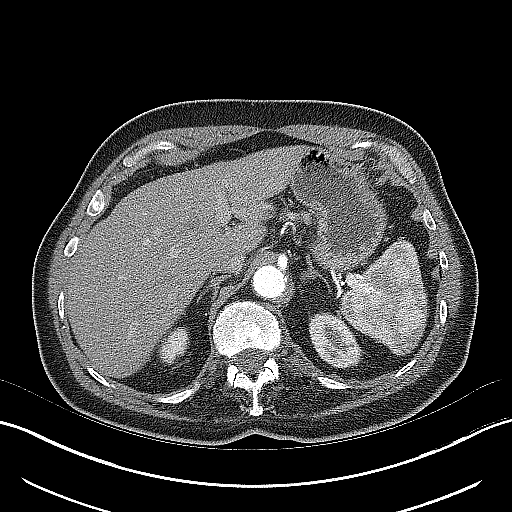
[im 26/118  soft-tissue]
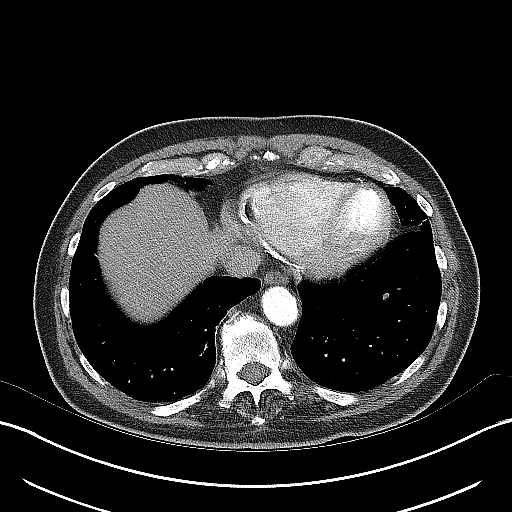

[Series 7: coronals · coronal · 0.66mm/px · 3 of 116 slices shown]
[im 29/116  soft-tissue]
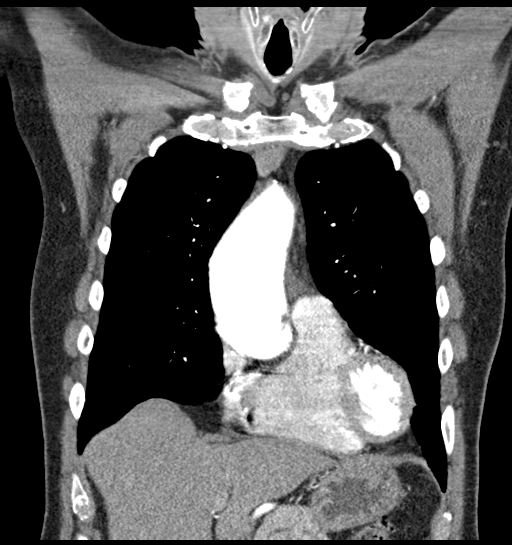
[im 58/116  soft-tissue]
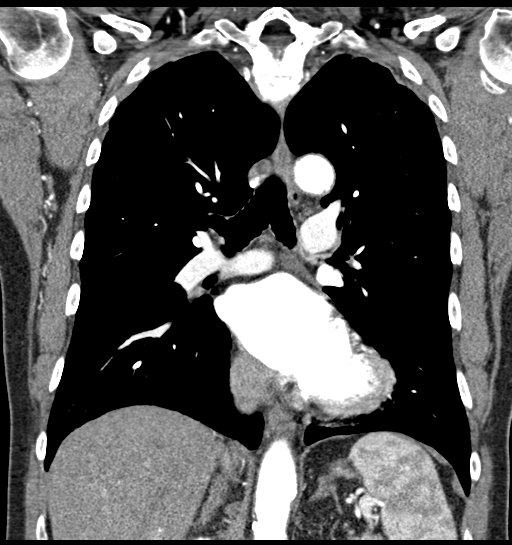
[im 87/116  soft-tissue]
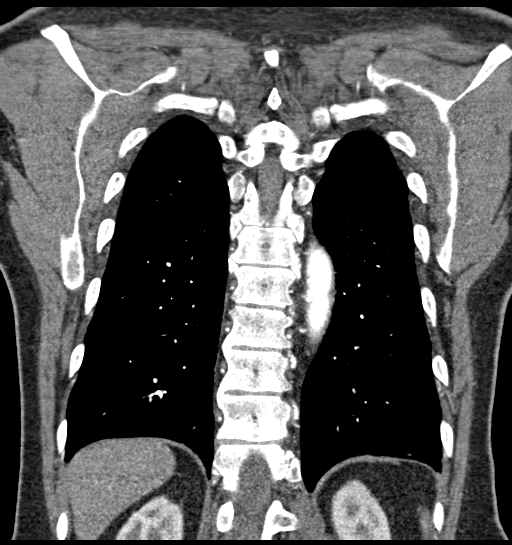

[18 of 46 positions shown; findings below may reference images not displayed]

FINDINGS: Cardiovascular: Stable fusiform aneurysmal dilation of the tubular
portion of the ascending thoracic aorta with a maximal transverse
diameter of 5.1 cm. No evidence of dissection. Conventional 3 vessel
arch anatomy. Scattered atherosclerotic vascular calcifications
along the aorta and throughout the coronary arteries. The aortic
root is normal in caliber. No effacement of the Suzuki
junction. The heart is normal in size. Normal caliber main pulmonary
artery. No evidence of pericardial effusion.

Mediastinum/Nodes: Incidental note made of small left-sided thyroid
nodule. At less than 1.5 cm, this nodule does not warrant further
dedicated imaging evaluation. No suspicious mediastinal or hilar
adenopathy. No soft tissue mediastinal mass. The thoracic esophagus
is unremarkable.

Lungs/Pleura: Stable small left lower lobe pulmonary nodule nodules.
None measures larger than 0.6 cm. Slight differences in measured
size likely secondary to differences in slice selection rather than
true size change. No new nodules identified. The lungs are otherwise
clear.

Upper Abdomen: No acute abnormality.

Musculoskeletal: No chest wall abnormality. No acute or significant
osseous findings.

Review of the MIP images confirms the above findings.
IMPRESSION: 1. Stable fusiform aneurysm of the ascending thoracic aorta at
cm. Ascending thoracic aortic aneurysm. Recommend semi-annual
imaging followup by CTA or MRA and referral to cardiothoracic
surgery if not already obtained. This recommendation follows 9262
ACCF/AHA/AATS/ACR/ASA/SCA/TIMO-JUHANI/JAMEELAH/MAYUMI/ATISHA Guidelines for the
Diagnosis and Management of Patients With Thoracic Aortic Disease.
Circulation. 9262; 121: E266-e369. Aortic aneurysm NOS
([id number].Q).
2. Stable small left lower lobe pulmonary nodules, likely benign.
Recommend continued attention on subsequent follow-up imaging.
3. Aortic and coronary artery atherosclerotic calcifications.

## 2020-12-29 MED ORDER — IOHEXOL 350 MG/ML SOLN
100.0000 mL | Freq: Once | INTRAVENOUS | Status: AC | PRN
  Administered 2020-12-29: 100 mL via INTRAVENOUS

## 2021-01-06 ENCOUNTER — Other Ambulatory Visit: Payer: Self-pay

## 2021-01-06 DIAGNOSIS — I77810 Thoracic aortic ectasia: Secondary | ICD-10-CM

## 2021-01-08 ENCOUNTER — Encounter: Payer: Self-pay | Admitting: Cardiovascular Disease

## 2021-01-08 ENCOUNTER — Other Ambulatory Visit: Payer: Self-pay

## 2021-01-08 ENCOUNTER — Ambulatory Visit: Payer: Medicare HMO | Admitting: Cardiovascular Disease

## 2021-01-08 VITALS — BP 128/72 | HR 60 | Ht 70.0 in | Wt 185.6 lb

## 2021-01-08 DIAGNOSIS — I712 Thoracic aortic aneurysm, without rupture: Secondary | ICD-10-CM

## 2021-01-08 DIAGNOSIS — I2510 Atherosclerotic heart disease of native coronary artery without angina pectoris: Secondary | ICD-10-CM

## 2021-01-08 DIAGNOSIS — I351 Nonrheumatic aortic (valve) insufficiency: Secondary | ICD-10-CM | POA: Diagnosis not present

## 2021-01-08 DIAGNOSIS — I7121 Aneurysm of the ascending aorta, without rupture: Secondary | ICD-10-CM

## 2021-01-08 DIAGNOSIS — E782 Mixed hyperlipidemia: Secondary | ICD-10-CM

## 2021-01-08 MED ORDER — ROSUVASTATIN CALCIUM 40 MG PO TABS: 40.0000 mg | ORAL_TABLET | Freq: Every day | ORAL | 3 refills | Status: DC

## 2021-01-08 NOTE — Progress Notes (Signed)
Cardiology Office Note:   Date:  01/08/2021  NAME:  Kristopher Flores    MRN: [id number] DOB:  1945-12-27   PCP:  Soundra Pilon, FNP  Cardiologist:  None    Referring MD: Soundra Pilon, FNP   Chief Complaint  Patient presents with  . Aneurysm   History of Present Illness:   Kristopher Flores is a 75 y.o. male with a hx of Ascending Ao Aneurysm, moderate AI, HLD who presents for follow-up.  He reports no symptoms today.  He was seen by cardiology in September 2021 after a dizziness episode.  It occurred while sitting.  It occurred late morning.  He had not had much water.  Symptoms resolved after several hours.  He had no chest pain or shortness of breath.  He reports he has had no further episodes.  He reports he does not exercise routinely but denies any chest pain or shortness of breath with this current level of activity.  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  He reports he can play golf without any limitations.  Denies any symptoms in office.  No family history of aortic dissection.  He reports his mother died suddenly at age 29.  No autopsy was performed but it was presumed she had a heart attack.  No history of sudden death in any family members.  He has never had an ascending aortic aneurysm history in the past.  No family history of this either.  He has a tricuspid aortic valve.  He has moderate aortic regurgitation.  He is a never smoker.  Denies any alcohol or drug use.  He is not married.  He does not have any children.  Medical history significant for hypertension which is well controlled.  LDL cholesterol 108.  We discussed switching to Crestor.  He is okay to do this.  He takes an aspirin.  Works for [employer].  They help people transition from present to civilian life.  Problem List 1. HTN 2. HLD -Total cholesterol 175, HDL 51, LDL 108, triglycerides  86 3. Ascending aortic aneurysm -Tricuspid AoV -06/27/2020: 51 mm -12/30/2019: 51 mm 4. Moderate AI 5. Coronary calcifications  Past Medical History: Past Medical History:  Diagnosis Date  . Aortic regurgitation   . Ascending aortic aneurysm (HCC)   . Cold sweat   . Elevated PSA   . Gout   . Hypercholesterolemia   . Hypertension   . Lightheaded   . Nauseous     Past Surgical History: Past Surgical History:  Procedure Laterality Date  . BIOPSY PHARYNX  2013  . HERNIA REPAIR  2016    Current Medications: Current Meds  Medication Sig  . allopurinol (ZYLOPRIM) 100 MG tablet Take 100 mg by mouth daily.  Marland Kitchen aspirin EC 81 MG tablet Take 81 mg by mouth daily. Swallow whole.  . lisinopril (ZESTRIL) 20 MG tablet Take 20 mg by mouth daily.  . Multiple Vitamin (MULTIVITAMIN) tablet Take 1 tablet by mouth daily.  . rosuvastatin (CRESTOR) 40 MG tablet Take 1 tablet (40 mg total) by mouth daily.  . sildenafil (REVATIO) 20 MG tablet Take 20 mg by mouth as needed.  . [DISCONTINUED] pravastatin (PRAVACHOL) 80 MG tablet Take 80 mg by mouth daily.     Allergies:    Patient has no known allergies.   Social History: Social History   Socioeconomic History  .  Marital status: Single    Spouse name: Not on file  . Number of children: 0  . Years of education: Not on file  . Highest education level: Not on file  Occupational History  . 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 Smoking status: Never Smoker  . Smokeless tobacco: Never Used  Vaping Use  . Vaping Use: Never used  Substance and Sexual Activity  . Alcohol use: Never  . Drug use: Never  . Sexual activity: Not Currently  Other Topics Concern  . Not on file  Social History Narrative  . Not on file   Social Determinants of Health   Financial Resource Strain: Not on file  Food Insecurity: Not on file  Transportation Needs: Not on file  Physical Activity: Not  on file  Stress: Not on file  Social Connections: Not on file     Family History: The patient's family history includes CVA in his father, maternal grandfather, maternal grandmother, and paternal grandfather; Heart attack (age of onset: 39) in his mother; Heart disease in his paternal grandfather; Osteoarthritis in his mother.  ROS:   All other ROS reviewed and negative. Pertinent positives noted in the HPI.     EKGs/Labs/Other Studies Reviewed:   The following studies were personally reviewed by me today:  TTE 06/10/2020  1. Moderate aortic regurgitation with eccentric jet, severely dilated  ascending aortic aneurysm with maximum diameter 52 mm.  2. Left ventricular ejection fraction, by estimation, is 60 to 65%. The  left ventricle has normal function. The left ventricle has no regional  wall motion abnormalities. There is mild concentric left ventricular  hypertrophy. Left ventricular diastolic  parameters are consistent with Grade I diastolic dysfunction (impaired  relaxation).  3. Right ventricular systolic function is normal. The right ventricular  size is normal. There is normal pulmonary artery systolic pressure. The  estimated right ventricular systolic pressure is 21.1 mmHg.  4. Left atrial size was mildly dilated.  5. The mitral valve is normal in structure. No evidence of mitral valve  regurgitation. No evidence of mitral stenosis.  6. The aortic valve is normal in structure. There is moderate  calcification of the aortic valve. There is moderate thickening of the  aortic valve. Aortic valve regurgitation is moderate. No aortic stenosis  is present.  7. Aortic dilatation noted. There is severe dilatation of the ascending  aorta, measuring 52 mm.  8. The inferior vena cava is normal in size with greater than 50%  respiratory variability, suggesting right atrial pressure of 3 mmHg.   Recent Labs: 12/24/2020: BUN 23; Creatinine, Ser 1.08; Potassium 4.5; Sodium  140   Recent Lipid Panel No results found for: CHOL, TRIG, HDL, CHOLHDL, VLDL, LDLCALC, LDLDIRECT  Physical Exam:   VS:  BP 128/72   Pulse 60   Ht 5\' 10"  (1.778 m)   Wt 185 lb 9.6 oz (84.2 kg)   SpO2 99%   BMI 26.63 kg/m    Wt Readings from Last 3 Encounters:  01/08/21 185 lb 9.6 oz (84.2 kg)  07/08/20 188 lb 12.8 oz (85.6 kg)  06/10/20 184 lb (83.5 kg)    General: Well nourished, well developed, in no acute distress Head: Atraumatic, normal size  Eyes: PEERLA, EOMI  Neck: Supple, no JVD Endocrine: No thryomegaly Cardiac: Normal S1, S2; RRR; faint systolic ejection murmur Lungs: Clear to auscultation bilaterally, no wheezing, rhonchi or rales  Abd: Soft, nontender, no hepatomegaly  Ext: No edema, pulses 2+ Musculoskeletal: No deformities, BUE and BLE strength normal and equal Skin: Warm and dry, no rashes   Neuro: Alert and oriented to person, place, time, and situation, CNII-XII grossly intact, no focal deficits  Psych: Normal mood and affect   ASSESSMENT:   Kristopher Flores is a 75 y.o. male who presents for the following: 1. Ascending aortic aneurysm (HCC)   2. Nonrheumatic aortic valve insufficiency   3. Coronary artery calcification seen on CAT scan   4. Mixed hyperlipidemia     PLAN:   1. Ascending aortic aneurysm (HCC) -Ascending aorta 51 mm.  449-month follow-up CT scan shows this is stable.  Echocardiogram matches CT at 52 mm.  He has a tricuspid aortic valve.  No family history of aortic dissection or aortic aneurysm.  His mom died suddenly at age 76.  The cause of death is reported as a heart attack however this could have been aortic pathology. -Unclear the etiology here.  Could be related to hypertension.  He is a never smoker. -Regardless he is not meeting surgical threshold.  It is stable.  I recommended to repeat an echocardiogram to see him back in 1 year.  As long as we get a good look on echo we can continue to monitor with this.  2. Nonrheumatic aortic  valve insufficiency -Moderate aortic regurgitation related to incomplete coaptation or prolapse related to distortion of the aortic valve annulus in the setting of an aneurysm.  LV function is normal.  Normal-sized LV.  We will continue to monitor this.  No indications for surgery at this time.  3. Coronary artery calcification seen on CAT scan 4. Mixed hyperlipidemia -Coronary calcification seen on chest CT.  No symptoms of angina.  Continue aspirin 81 mg daily.  I recommended to switch to Crestor 40 mg daily.  This will get his LDL cholesterol much lower.  Would aim for < 70.    Disposition: Return in about 1 year (around 01/08/2022).  Medication Adjustments/Labs and Tests Ordered: Current medicines are reviewed at length with the patient today.  Concerns regarding medicines are outlined above.  Orders Placed This Encounter  Procedures  . ECHOCARDIOGRAM COMPLETE   Meds ordered this encounter  Medications  . rosuvastatin (CRESTOR) 40 MG tablet    Sig: Take 1 tablet (40 mg total) by mouth daily.    Dispense:  90 tablet    Refill:  3    Patient Instructions  Medication Instructions:  Stop Pravastatin  Start Crestor 40 mg daily   *If you need a refill on your cardiac medications before your next appointment, please call your pharmacy*   Testing/Procedures: Echocardiogram (before follow up in 12 months) - Your physician has requested that you have an echocardiogram. Echocardiography is a painless test that uses sound waves to create images of your heart. It provides your doctor with information about the size and shape of your heart and how well your heart's chambers and valves are working. This procedure takes approximately one hour. There are no restrictions for this procedure. [facility] [address], [address].    Follow-Up: At [facility], you and your health needs are our priority.  As part of our continuing mission to provide you  with exceptional heart care, we have created designated Provider Care Teams.  These Care Teams include your primary Cardiologist (physician) and Advanced Practice Providers (APPs -  Physician Assistants and Nurse Practitioners) who all  work together to provide you with the care you need, when you need it.  We recommend signing up for the patient portal called "MyChart".  Sign up information is provided on this After Visit Summary.  MyChart is used to connect with patients for Virtual Visits (Telemedicine).  Patients are able to view lab/test results, encounter notes, upcoming appointments, etc.  Non-urgent messages can be sent to your provider as well.   To learn more about what you can do with MyChart, go to [email or web address].    Your next appointment:   12 month(s)  The format for your next appointment:   In Person  Provider:   Lennie Odor, MD        Time Spent with Patient: I have spent a total of 25 minutes with patient reviewing hospital notes, telemetry, EKGs, labs and examining the patient as well as establishing an assessment and plan that was discussed with the patient.  > 50% of time was spent in direct patient care.  Signed, Lenna Gilford. Flora Lipps, MD, [facility]  [facility]  [address], [address] [place], [place] [address] [phone]  01/08/2021 9:38 AM

## 2021-01-08 NOTE — Patient Instructions (Addendum)
Medication Instructions:  Stop Pravastatin  Start Crestor 40 mg daily   *If you need a refill on your cardiac medications before your next appointment, please call your pharmacy*   Testing/Procedures: Echocardiogram (before follow up in 12 months) - Your physician has requested that you have an echocardiogram. Echocardiography is a painless test that uses sound waves to create images of your heart. It provides your doctor with information about the size and shape of your heart and how well your heart's chambers and valves are working. This procedure takes approximately one hour. There are no restrictions for this procedure. This will be performed at our [facility] location - [address], [address].    Follow-Up: At [facility], you and your health needs are our priority.  As part of our continuing mission to provide you with exceptional heart care, we have created designated Provider Care Teams.  These Care Teams include your primary Cardiologist (physician) and Advanced Practice Providers (APPs -  Physician Assistants and Nurse Practitioners) who all work together to provide you with the care you need, when you need it.  We recommend signing up for the patient portal called "MyChart".  Sign up information is provided on this After Visit Summary.  MyChart is used to connect with patients for Virtual Visits (Telemedicine).  Patients are able to view lab/test results, encounter notes, upcoming appointments, etc.  Non-urgent messages can be sent to your provider as well.   To learn more about what you can do with MyChart, go to [email or web address].    Your next appointment:   12 month(s)  The format for your next appointment:   In Person  Provider:   Lennie Odor, MD

## 2021-06-24 DIAGNOSIS — I10 Essential (primary) hypertension: Secondary | ICD-10-CM | POA: Diagnosis not present

## 2021-06-24 DIAGNOSIS — G4700 Insomnia, unspecified: Secondary | ICD-10-CM | POA: Diagnosis not present

## 2021-06-24 DIAGNOSIS — Z6827 Body mass index (BMI) 27.0-27.9, adult: Secondary | ICD-10-CM | POA: Diagnosis not present

## 2021-06-24 DIAGNOSIS — E785 Hyperlipidemia, unspecified: Secondary | ICD-10-CM | POA: Diagnosis not present

## 2021-06-24 DIAGNOSIS — M1A0790 Idiopathic chronic gout, unspecified ankle and foot, without tophus (tophi): Secondary | ICD-10-CM | POA: Diagnosis not present

## 2021-06-24 DIAGNOSIS — G629 Polyneuropathy, unspecified: Secondary | ICD-10-CM | POA: Diagnosis not present

## 2021-12-29 DIAGNOSIS — I10 Essential (primary) hypertension: Secondary | ICD-10-CM | POA: Diagnosis not present

## 2022-01-08 ENCOUNTER — Ambulatory Visit (HOSPITAL_COMMUNITY): Payer: Medicare HMO | Attending: Internal Medicine

## 2022-01-08 DIAGNOSIS — I7121 Aneurysm of the ascending aorta, without rupture: Secondary | ICD-10-CM | POA: Insufficient documentation

## 2022-01-08 LAB — ECHOCARDIOGRAM COMPLETE
Area-P 1/2: 2.39 cm2
P 1/2 time: 333 msec
S' Lateral: 3.3 cm

## 2022-01-13 ENCOUNTER — Other Ambulatory Visit: Payer: Self-pay

## 2022-01-13 DIAGNOSIS — I7121 Aneurysm of the ascending aorta, without rupture: Secondary | ICD-10-CM

## 2022-02-05 ENCOUNTER — Telehealth: Payer: Self-pay | Admitting: Cardiovascular Disease

## 2022-02-05 NOTE — Telephone Encounter (Signed)
Patient was calling to get results from his Echo

## 2022-02-09 NOTE — Telephone Encounter (Signed)
Spoke with patient about ECHO results.   Patient verbalized understanding.

## 2022-02-15 ENCOUNTER — Other Ambulatory Visit: Payer: Self-pay

## 2022-02-15 ENCOUNTER — Other Ambulatory Visit: Payer: Self-pay | Admitting: Cardiovascular Disease

## 2022-02-15 DIAGNOSIS — I7121 Aneurysm of the ascending aorta, without rupture: Secondary | ICD-10-CM | POA: Diagnosis not present

## 2022-02-15 LAB — BASIC METABOLIC PANEL
BUN/Creatinine Ratio: 15 (ref 10–24)
BUN: 15 mg/dL (ref 8–27)
CO2: 26 mmol/L (ref 20–29)
Calcium: 9.2 mg/dL (ref 8.6–10.2)
Chloride: 101 mmol/L (ref 96–106)
Creatinine, Ser: 1.01 mg/dL (ref 0.76–1.27)
Glucose: 98 mg/dL (ref 70–99)
Potassium: 4.5 mmol/L (ref 3.5–5.2)
Sodium: 139 mmol/L (ref 134–144)
eGFR: 77 mL/min/{1.73_m2} (ref >59)

## 2022-02-16 ENCOUNTER — Encounter: Payer: Self-pay | Admitting: *Deleted

## 2022-03-19 ENCOUNTER — Other Ambulatory Visit: Payer: Self-pay

## 2022-03-19 DIAGNOSIS — I7121 Aneurysm of the ascending aorta, without rupture: Secondary | ICD-10-CM

## 2022-03-26 ENCOUNTER — Other Ambulatory Visit: Payer: Self-pay

## 2022-03-26 DIAGNOSIS — I7121 Aneurysm of the ascending aorta, without rupture: Secondary | ICD-10-CM

## 2022-03-26 LAB — BASIC METABOLIC PANEL
BUN/Creatinine Ratio: 20 (ref 10–24)
BUN: 21 mg/dL (ref 8–27)
CO2: 25 mmol/L (ref 20–29)
Calcium: 9.6 mg/dL (ref 8.6–10.2)
Chloride: 102 mmol/L (ref 96–106)
Creatinine, Ser: 1.06 mg/dL (ref 0.76–1.27)
Glucose: 82 mg/dL (ref 70–99)
Potassium: 4.6 mmol/L (ref 3.5–5.2)
Sodium: 139 mmol/L (ref 134–144)
eGFR: 73 mL/min/{1.73_m2} (ref >59)

## 2022-03-30 ENCOUNTER — Ambulatory Visit (HOSPITAL_COMMUNITY)
Admission: RE | Admit: 2022-03-30 | Discharge: 2022-03-30 | Disposition: A | Discharge status: Home / Self Care | Payer: Medicare HMO | Source: Ambulatory Visit | Attending: Cardiovascular Disease | Admitting: Cardiovascular Disease

## 2022-03-30 DIAGNOSIS — I7121 Aneurysm of the ascending aorta, without rupture: Secondary | ICD-10-CM | POA: Diagnosis not present

## 2022-03-30 MED ORDER — IOHEXOL 350 MG/ML SOLN
80.0000 mL | Freq: Once | INTRAVENOUS | Status: AC | PRN
  Administered 2022-03-30: 80 mL via INTRAVENOUS

## 2022-04-15 NOTE — Progress Notes (Signed)
Cardiology Office Note:   Date:  04/16/2022  NAME:  Kristopher Flores    MRN: [id number] DOB:  08/19/1946   PCP:  Soundra Pilon, FNP  Cardiologist:  None  Electrophysiologist:  None   Referring MD: Soundra Pilon, FNP   Chief Complaint  Patient presents with   Follow-up         History of Present Illness:   Kristopher Flores is a 76 y.o. male with a hx of Asc Ao aneurysm, moderate AI, HLD who presents for follow-up.  We discussed the results of his recent gated CTA.  This shows stability in his aortic aneurysm at 51 mm.  It has been stable for 2 years now.  We did discuss that we will likely continue to monitor this for a few more years.  If he continues to show stability we may forego further surveillance.  He reports no chest pain or trouble breathing.  He is not exercising that often.  He presents with his wife.  He has plans to exercise soon.  Cholesterol level is acceptable.  EKG is normal.  He denies any chest pain or trouble breathing.  He overall is doing well.  We did discuss his ultrasound shows moderate aortic insufficiency.  This is also stable.  Problem List 1. HTN 2. HLD -Total cholesterol 132, HDL 53, LDL 58, triglycerides 119 3. Ascending aortic aneurysm -Tricuspid AoV -06/27/2020: 51 mm -12/29/2020: 51 mm -03/30/2022: 51 mm  4. Moderate AI 5. Coronary calcifications  Past Medical History: Past Medical History:  Diagnosis Date   Aortic regurgitation    Ascending aortic aneurysm (HCC)    Cold sweat    Elevated PSA    Gout    Hypercholesterolemia    Hypertension    Lightheaded    Nauseous     Past Surgical History: Past Surgical History:  Procedure Laterality Date   BIOPSY PHARYNX  2013   HERNIA REPAIR  2016    Current Medications: Current Meds  Medication Sig   aspirin EC 81 MG tablet Take 81 mg by mouth daily. Swallow whole.   lisinopril (ZESTRIL) 20 MG tablet Take 20 mg by mouth daily.   Multiple Vitamin (MULTIVITAMIN) tablet Take 1 tablet by  mouth daily.   pravastatin (PRAVACHOL) 80 MG tablet Take 80 mg by mouth daily.   sildenafil (REVATIO) 20 MG tablet Take 20 mg by mouth as needed.     Allergies:    Patient has no known allergies.   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)  Tobacco Use   Smoking status: Never   Smokeless tobacco: Never  Vaping Use   Vaping Use: Never used  Substance and Sexual Activity   Alcohol use: Never   Drug use: Never   Sexual activity: Not Currently  Other Topics Concern   Not on file  Social History Narrative   Not on file   Social Determinants of Health   Financial Resource Strain: Not on file  Food Insecurity: Not on file  Transportation Needs: Not on file  Physical Activity: Not on file  Stress: Not on file  Social Connections: Not on file     Family History: The patient's family  history includes CVA in his father, maternal grandfather, maternal grandmother, and paternal grandfather; Heart attack (age of onset: 37) in his mother; Heart disease in his paternal grandfather; Osteoarthritis in his mother.  ROS:   All other ROS reviewed and negative. Pertinent positives noted in the HPI.     EKGs/Labs/Other Studies Reviewed:   The following studies were personally reviewed by me today:  EKG:  EKG is ordered today.  The ekg ordered today demonstrates normal sinus rhythm heart rate 61, no acute ischemic changes or evidence of infarction, and was personally reviewed by me.  TTE 01/08/2022  1. Left ventricular ejection fraction, by estimation, is 60 to 65%. Left  ventricular ejection fraction by 3D volume is 62 %. The left ventricle has  normal function. The left ventricle has no regional wall motion   abnormalities. There is mild concentric  left ventricular hypertrophy. Left ventricular diastolic parameters are  consistent with Grade I diastolic dysfunction (impaired relaxation). The  average left ventricular global longitudinal strain is -23.3 %. The global  longitudinal strain is normal.   2. Right ventricular systolic function is normal. The right ventricular  size is normal. There is normal pulmonary artery systolic pressure. The  estimated right ventricular systolic pressure is 28.4 mmHg.   3. The mitral valve is normal in structure. No evidence of mitral valve  regurgitation. No evidence of mitral stenosis.   4. The aortic valve is tricuspid. There is moderate calcification of the  aortic valve. There is mild thickening of the aortic valve. Aortic valve  regurgitation is moderate, but eccentric. Aortic valve sclerosis is  present, with no evidence of aortic  valve stenosis. There is no diastolic flow reversal.   5. Aortic dilatation noted. There is severe dilatation of the ascending  aorta, measuring 54 mm.   6. The inferior vena cava is dilated in size with >50% respiratory  variability, suggesting right atrial pressure of 8 mmHg.   Recent Labs: 03/26/2022: BUN 21; Creatinine, Ser 1.06; Potassium 4.6; Sodium 139   Recent Lipid Panel No results found for: "CHOL", "TRIG", "HDL", "CHOLHDL", "VLDL", "LDLCALC", "LDLDIRECT"  Physical Exam:   VS:  BP 120/74   Pulse 61   Ht 5\' 10"  (1.778 m)   Wt 192 lb 3.2 oz (87.2 kg)   SpO2 95%   BMI 27.58 kg/m    Wt Readings from Last 3 Encounters:  04/16/22 192 lb 3.2 oz (87.2 kg)  01/08/21 185 lb 9.6 oz (84.2 kg)  07/08/20 188 lb 12.8 oz (85.6 kg)    General: Well nourished, well developed, in no acute distress Head: Atraumatic, normal size  Eyes: PEERLA, EOMI  Neck: Supple, no JVD Endocrine: No thryomegaly Cardiac: Normal S1, S2; RRR; no murmurs, rubs, or gallops Lungs: Clear to auscultation bilaterally, no wheezing, rhonchi or  rales  Abd: Soft, nontender, no hepatomegaly  Ext: No edema, pulses 2+ Musculoskeletal: No deformities, BUE and BLE strength normal and equal Skin: Warm and dry, no rashes   Neuro: Alert and oriented to person, place, time, and situation, CNII-XII grossly intact, no focal deficits  Psych: Normal mood and affect   ASSESSMENT:   Kristopher Flores is a 76 y.o. male who presents for the following: 1. Aneurysm of ascending aorta without rupture (HCC)   2. Nonrheumatic aortic valve insufficiency   3. Mixed hyperlipidemia     PLAN:   1. Aneurysm of ascending aorta without rupture (HCC) -Ascending aortic aneurysm up to 51 mm.  Has been stable over the past  2 years.  Will repeat a CT scan in 1 year.  His blood pressure is very well-controlled.  He will continue lisinopril 20 mg daily.  We discussed that aerobic activity is okay.  He will avoid heavy lifting such as 100% maximum lifting.  I am okay with machines.  He will pursue 70% of maximum.  No indications for surgery.  Tricuspid aortic valve.  2. Nonrheumatic aortic valve insufficiency -Tricuspid aortic valve.  Moderate.  We will repeat an echocardiogram in 2 to 3 years.  3. Mixed hyperlipidemia -Coronary calcifications noted.  On pravastatin.  Most recent LDL 58.  EKG is normal.  No symptoms of angina.      Disposition: Return in about 1 year (around 04/17/2023).  Medication Adjustments/Labs and Tests Ordered: Current medicines are reviewed at length with the patient today.  Concerns regarding medicines are outlined above.  Orders Placed This Encounter  Procedures   EKG 12-Lead   No orders of the defined types were placed in this encounter.   Patient Instructions  Medication Instructions:  The current medical regimen is effective;  continue present plan and medications.  *If you need a refill on your cardiac medications before your next appointment, please call your pharmacy*   Testing/Procedures: CT CHEST AORTA GATED (12  months)    Follow-Up: At [facility], you and your health needs are our priority.  As part of our continuing mission to provide you with exceptional heart care, we have created designated Provider Care Teams.  These Care Teams include your primary Cardiologist (physician) and Advanced Practice Providers (APPs -  Physician Assistants and Nurse Practitioners) who all work together to provide you with the care you need, when you need it.  We recommend signing up for the patient portal called "MyChart".  Sign up information is provided on this After Visit Summary.  MyChart is used to connect with patients for Virtual Visits (Telemedicine).  Patients are able to view lab/test results, encounter notes, upcoming appointments, etc.  Non-urgent messages can be sent to your provider as well.   To learn more about what you can do with MyChart, go to [email or web address].    Your next appointment:   12 month(s)  The format for your next appointment:   In Person  Provider:   Lennie Odor, MD             Time Spent with Patient: I have spent a total of 25 minutes with patient reviewing hospital notes, telemetry, EKGs, labs and examining the patient as well as establishing an assessment and plan that was discussed with the patient.  > 50% of time was spent in direct patient care.  Signed, Lenna Gilford. Flora Lipps, MD, [facility]  [facility]  [address], [address] [place], [place] [address] [phone]  04/16/2022 10:27 AM

## 2022-04-16 ENCOUNTER — Encounter: Payer: Self-pay | Admitting: Cardiovascular Disease

## 2022-04-16 ENCOUNTER — Ambulatory Visit: Payer: Medicare HMO | Admitting: Cardiovascular Disease

## 2022-04-16 VITALS — BP 120/74 | HR 61 | Ht 70.0 in | Wt 192.2 lb

## 2022-04-16 DIAGNOSIS — I351 Nonrheumatic aortic (valve) insufficiency: Secondary | ICD-10-CM | POA: Diagnosis not present

## 2022-04-16 DIAGNOSIS — I7121 Aneurysm of the ascending aorta, without rupture: Secondary | ICD-10-CM

## 2022-04-16 DIAGNOSIS — E782 Mixed hyperlipidemia: Secondary | ICD-10-CM

## 2022-04-16 NOTE — Patient Instructions (Signed)
Medication Instructions:  The current medical regimen is effective;  continue present plan and medications.  *If you need a refill on your cardiac medications before your next appointment, please call your pharmacy*   Testing/Procedures: CT CHEST AORTA GATED (12 months)    Follow-Up: At [facility], you and your health needs are our priority.  As part of our continuing mission to provide you with exceptional heart care, we have created designated Provider Care Teams.  These Care Teams include your primary Cardiologist (physician) and Advanced Practice Providers (APPs -  Physician Assistants and Nurse Practitioners) who all work together to provide you with the care you need, when you need it.  We recommend signing up for the patient portal called "MyChart".  Sign up information is provided on this After Visit Summary.  MyChart is used to connect with patients for Virtual Visits (Telemedicine).  Patients are able to view lab/test results, encounter notes, upcoming appointments, etc.  Non-urgent messages can be sent to your provider as well.   To learn more about what you can do with MyChart, go to [email or web address].    Your next appointment:   12 month(s)  The format for your next appointment:   In Person  Provider:   Lennie Odor, MD

## 2022-07-20 IMAGING — CT CT ANGIO CHEST
3 of 8 series · 18 of 46 positions shown · IV contrast (OMNIPAQUE 350)
Comparison: Report from echocardiography dated 06/10/2020

CLINICAL DATA: Aneurysmal disease of the ascending thoracic aorta
and aortic valvular insufficiency by echocardiography

EXAM:
CT ANGIOGRAPHY CHEST WITH CONTRAST
TECHNIQUE: Multidetector CT imaging of the chest was performed using the
standard protocol during bolus administration of intravenous
contrast. Multiplanar CT image reconstructions and MIPs were
obtained to evaluate the vascular anatomy.
CONTRAST:  100mL OMNIPAQUE IOHEXOL 350 MG/ML SOLN

[Series 4: aorta 3.0 bf37 2 · axial · 0.73mm/px · z∈[-333,-51]mm · 13 of 110 slices shown]
[im 8/110  lung]
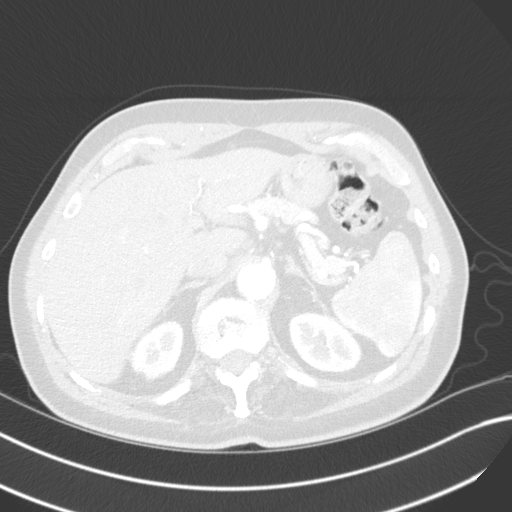
[im 16/110  soft-tissue]
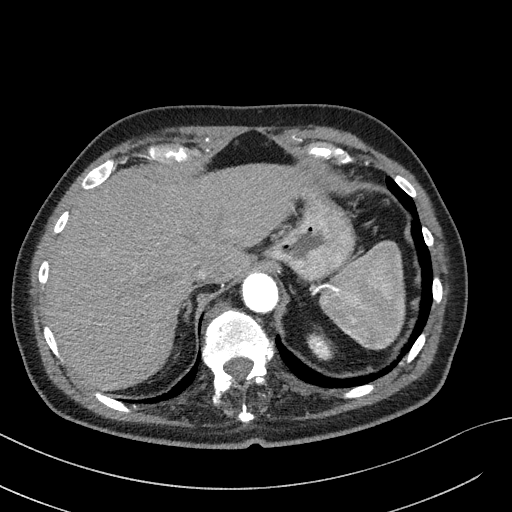
[im 24/110  lung]
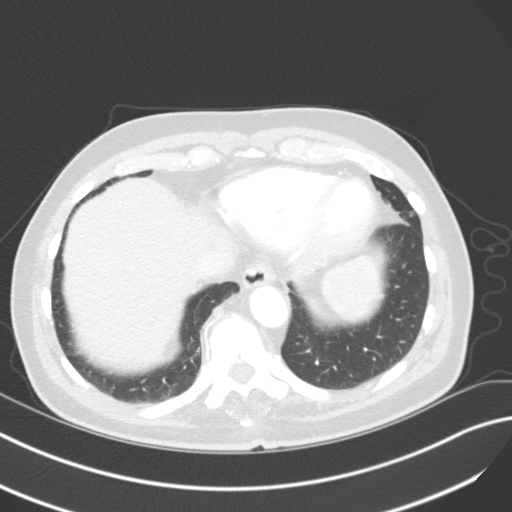
[im 32/110  soft-tissue]
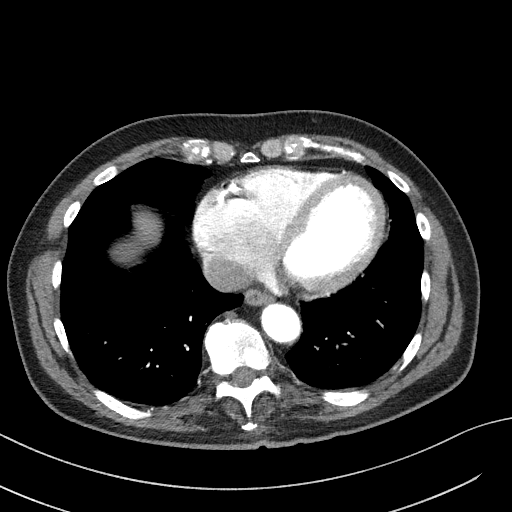
[im 39/110  lung]
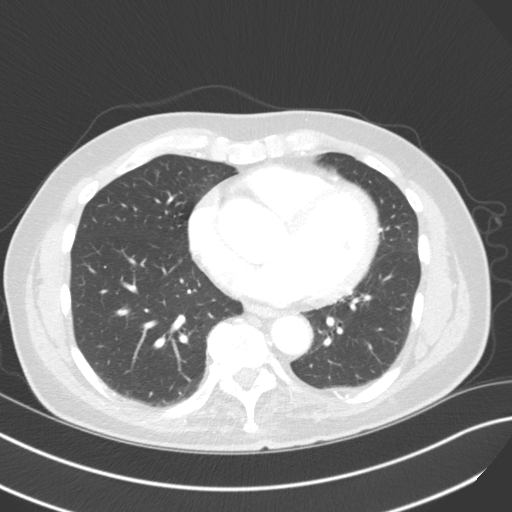
[im 47/110  soft-tissue]
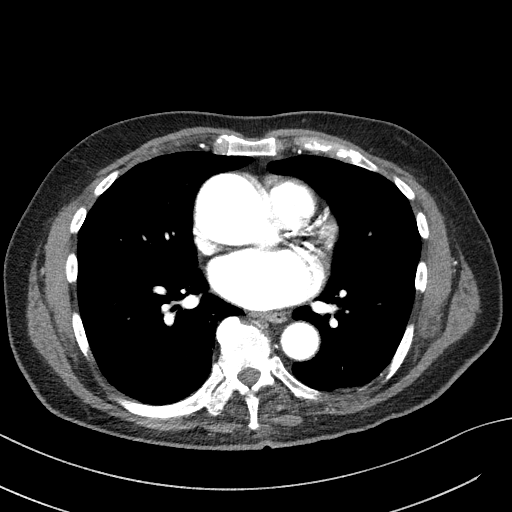
[im 55/110  lung]
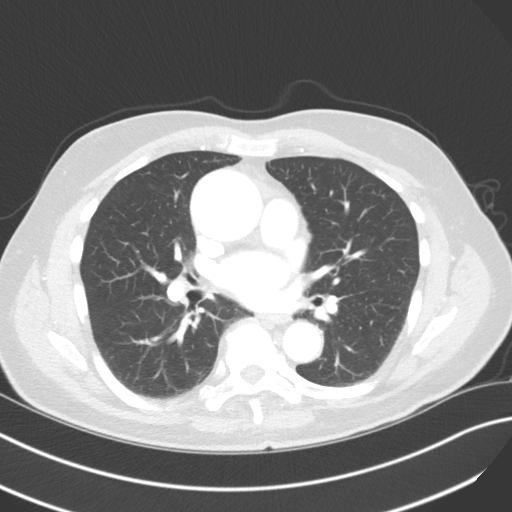
[im 63/110  soft-tissue]
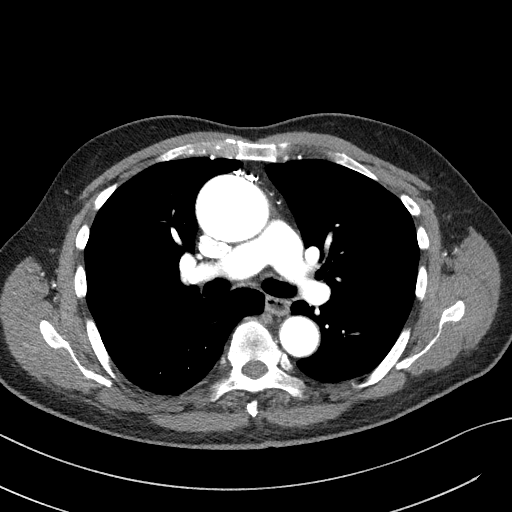
[im 71/110  lung]
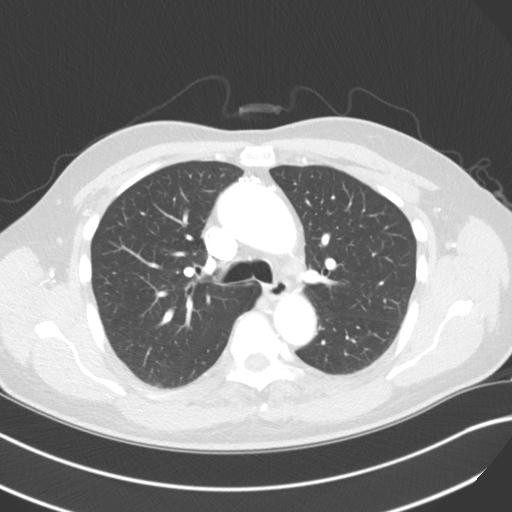
[im 78/110  soft-tissue]
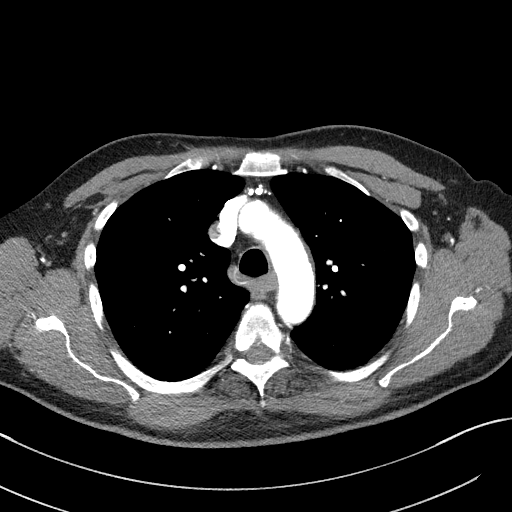
[im 86/110  lung]
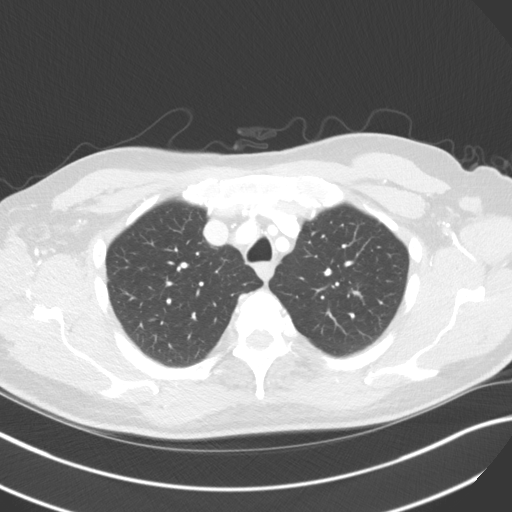
[im 94/110  soft-tissue]
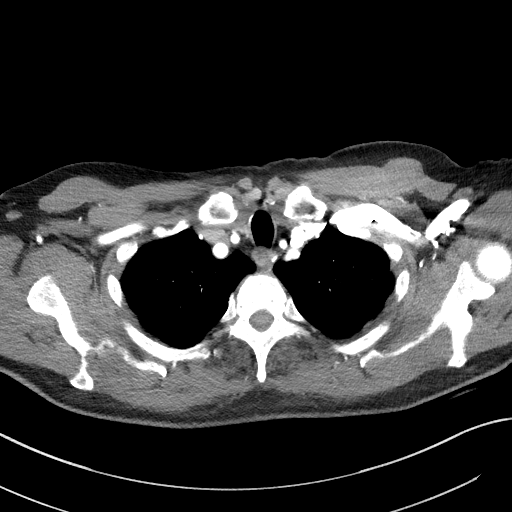
[im 102/110  lung]
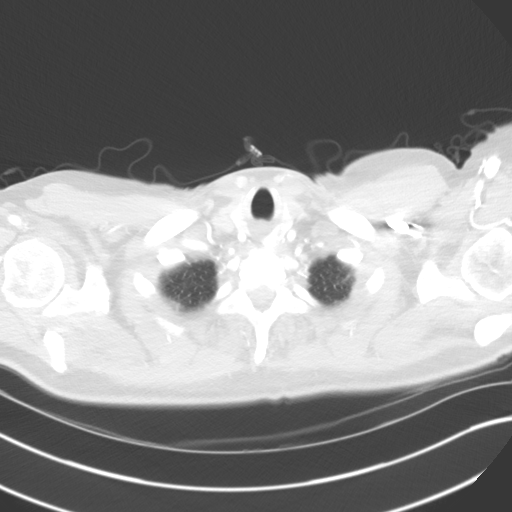

[Series 5: lung · axial · 0.73mm/px · z∈[-333,-285]mm · 2 of 110 slices shown]
[im 8/110  soft-tissue]
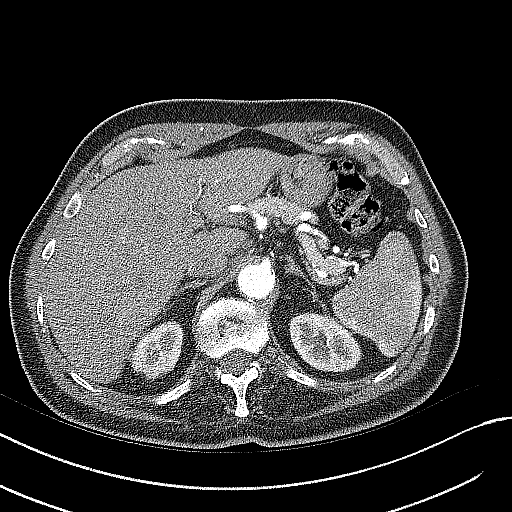
[im 24/110  soft-tissue]
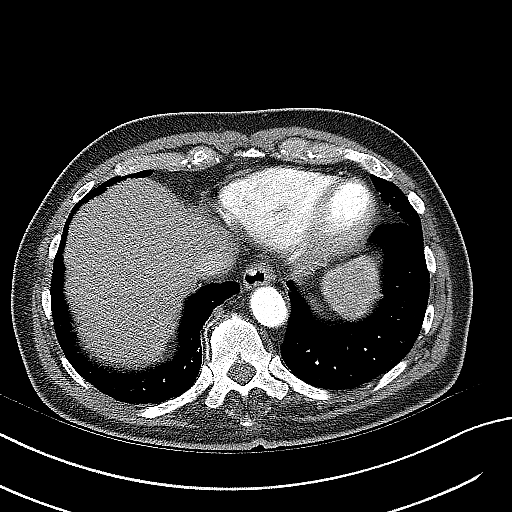

[Series 7: coronals · coronal · 0.67mm/px · 3 of 136 slices shown]
[im 34/136  soft-tissue]
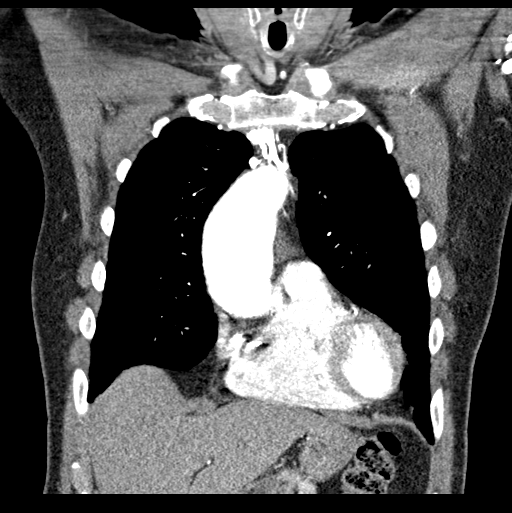
[im 68/136  soft-tissue]
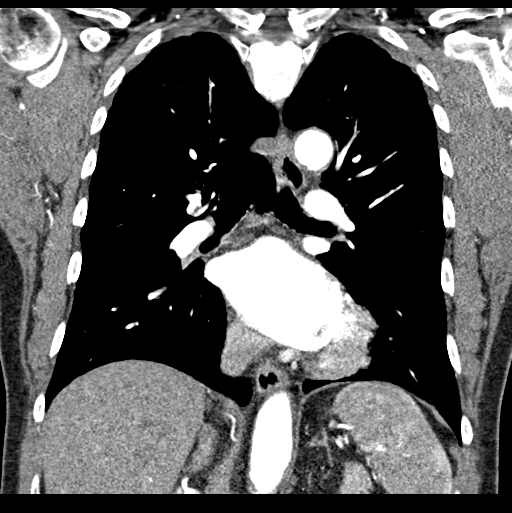
[im 102/136  soft-tissue]
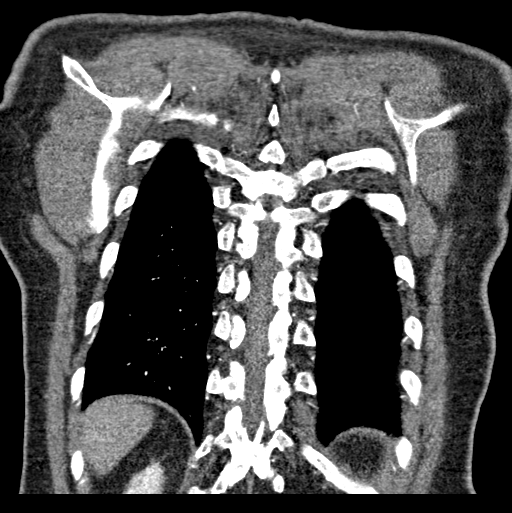

[18 of 46 positions shown; findings below may reference images not displayed]

FINDINGS: Cardiovascular: The aortic root measures approximately 3.7-3.8 cm at
the level of the sinuses of Valsalva. The ascending thoracic aorta
shows significant dilatation and measures approximately 5.1 cm in
greatest diameter. The proximal arch measures approximately 3.3 cm
and the distal arch 2.9 cm. The descending thoracic aorta measures
2.7 cm. No evidence of aortic dissection. Scattered mild
atherosclerotic plaque is identified at the level of the arch and
descending thoracic aorta. Visualized proximal great vessels
demonstrate normal patency and normal branching anatomy.

The heart size is within normal limits. Calcified coronary artery
plaque is identified in a 3 vessel distribution. No pericardial
fluid identified. Central pulmonary arteries are normal in caliber.

Mediastinum/Nodes: No enlarged mediastinal, hilar, or axillary lymph
nodes. Thyroid gland, trachea, and esophagus demonstrate no
significant findings.

Lungs/Pleura: 6 mm and 3 mm left lower lobe pulmonary nodules are
present towards the lung base. There may be an additional 4 mm
nodule at the diaphragmatic surface of the left lower lobe. There is
no evidence of pulmonary edema, consolidation, pneumothorax or
pleural fluid.

Upper Abdomen: Calcified granuloma present in the upper spleen.

Musculoskeletal: No chest wall abnormality. No acute or significant
osseous findings.

Review of the MIP images confirms the above findings.
IMPRESSION: 1. Dilated ascending thoracic aorta measuring approximately 5.1 cm
in greatest diameter.
2. Coronary atherosclerosis with calcified coronary artery plaque in
a 3 vessel distribution.
3. Small left lower lobe pulmonary nodules measuring 6 mm and 3 mm
in greatest diameter. There may be an additional 4 mm nodule on the
diaphragmatic surface of the left lower lobe. Non-contrast chest CT
at 3-6 months is recommended. If the nodules are stable at time of
repeat CT, then future CT at 18-24 months (from today's scan) is
considered optional for low-risk patients, but is recommended for
high-risk patients. This recommendation follows the consensus
statement: Guidelines for Management of Incidental Pulmonary Nodules
Detected on CT Images: From the [HOSPITAL] 7565; Radiology

Aortic aneurysm NOS ([id number]).

## 2022-09-22 DIAGNOSIS — E785 Hyperlipidemia, unspecified: Secondary | ICD-10-CM | POA: Diagnosis not present

## 2022-09-22 DIAGNOSIS — Z125 Encounter for screening for malignant neoplasm of prostate: Secondary | ICD-10-CM | POA: Diagnosis not present

## 2022-09-22 DIAGNOSIS — Z1211 Encounter for screening for malignant neoplasm of colon: Secondary | ICD-10-CM | POA: Diagnosis not present

## 2022-09-22 DIAGNOSIS — N529 Male erectile dysfunction, unspecified: Secondary | ICD-10-CM | POA: Diagnosis not present

## 2022-09-22 DIAGNOSIS — M109 Gout, unspecified: Secondary | ICD-10-CM | POA: Diagnosis not present

## 2022-09-22 DIAGNOSIS — I10 Essential (primary) hypertension: Secondary | ICD-10-CM | POA: Diagnosis not present

## 2022-09-22 DIAGNOSIS — Z0000 Encounter for general adult medical examination without abnormal findings: Secondary | ICD-10-CM | POA: Diagnosis not present

## 2022-09-22 DIAGNOSIS — G4700 Insomnia, unspecified: Secondary | ICD-10-CM | POA: Diagnosis not present

## 2023-03-23 DIAGNOSIS — R42 Dizziness and giddiness: Secondary | ICD-10-CM | POA: Diagnosis not present

## 2023-03-23 DIAGNOSIS — N529 Male erectile dysfunction, unspecified: Secondary | ICD-10-CM | POA: Diagnosis not present

## 2023-03-23 DIAGNOSIS — I7121 Aneurysm of the ascending aorta, without rupture: Secondary | ICD-10-CM | POA: Diagnosis not present

## 2023-03-23 DIAGNOSIS — I5189 Other ill-defined heart diseases: Secondary | ICD-10-CM | POA: Diagnosis not present

## 2023-03-23 DIAGNOSIS — G4700 Insomnia, unspecified: Secondary | ICD-10-CM | POA: Diagnosis not present

## 2023-03-23 DIAGNOSIS — I10 Essential (primary) hypertension: Secondary | ICD-10-CM | POA: Diagnosis not present

## 2023-03-23 DIAGNOSIS — E785 Hyperlipidemia, unspecified: Secondary | ICD-10-CM | POA: Diagnosis not present

## 2023-03-23 DIAGNOSIS — G629 Polyneuropathy, unspecified: Secondary | ICD-10-CM | POA: Diagnosis not present

## 2023-03-23 DIAGNOSIS — M109 Gout, unspecified: Secondary | ICD-10-CM | POA: Diagnosis not present

## 2023-04-18 NOTE — Progress Notes (Deleted)
Cardiology Office Note:  .   Date:  04/18/2023  ID:  Kristopher Flores, DOB [birth year], MRN [id number] PCP: Soundra Pilon, FNP  [facility] Providers Cardiologist:  Reatha Harps, MD   History of Present Illness: .   Kristopher Flores is a 77 y.o. male with a past medical history of ascending aortic aneurysm, moderate AI, HLD. Patient is followed by Dr. Flora Lipps and presents today for an annual follow up appointment   Per chart review, patient previously had an echocardiogram in 06/2020 that showed moderate AI with severely dilated ascending aortic aneurysm with max diameter 52 mm, EF 60-65%, no regional wall motion abnormalities, mild LVH, grade I DD. CTA chest 06/27/20 showed dilated ascending thoracic aorta measuring 5.1 cm. Repeat CTA chest in 12/2020 showed stable ascending thoracic aortic aneurysm. Echocardiogram from 01/08/22 showed EF 60-65%, no regional wall motion abnormalities, mild LVH, grade I DD, normal RV function. Aortic valve regurgitation was moderate. There was severe dilation of the ascending aorta measuring 54 mm. As there was concern that ascending aorta dilation had worsened, patient underwent chest CTA 03/30/22 showed stable appearance of ascending thoracic aortic aneurysm measuring 5.1 cm. Dr. Flora Lipps recommended repeating CTA in 1 year   Aneurysm of ascending aorta without rupture  - Patient has an ascending aortic aneurysm that has measured 5.1 cm on chest CTA since 06/2020  - Patient is due for CTA chest now- placed order  - Ordered BMP to assess renal function prior to CTA  - BP is very well controlled  - Discussed that his is OK to continue aerobic activity, but he should avoid heavy lifting   HTN  - BP well controlled  - Continue lisinopril 20 mg daily   Nonrheumatic aortic valve insufficiency  - Echocardiogram in 01/2022 showed moderate AI. Patient does have a tricuspid aortic valve  - Plan to repeat echocardiogram next year***  HLD     ROS:  ***  Studies Reviewed: .        *** Risk Assessment/Calculations:   {Does this patient have ATRIAL FIBRILLATION?:[phone]} No BP recorded.  {Refresh Note OR Click here to enter BP  :1}***       Physical Exam:   VS:  There were no vitals taken for this visit.   Wt Readings from Last 3 Encounters:  04/16/22 192 lb 3.2 oz (87.2 kg)  01/08/21 185 lb 9.6 oz (84.2 kg)  07/08/20 188 lb 12.8 oz (85.6 kg)    GEN: Well nourished, well developed in no acute distress NECK: No JVD; No carotid bruits CARDIAC: ***RRR, no murmurs, rubs, gallops RESPIRATORY:  Clear to auscultation without rales, wheezing or rhonchi  ABDOMEN: Soft, non-tender, non-distended EXTREMITIES:  No edema; No deformity   ASSESSMENT AND PLAN: .   ***    {Are you ordering a CV Procedure (e.g. stress test, cath, DCCV, TEE, etc)?   Press F2        :[id number]}  Dispo: ***  Signed, Jonita Albee, PA-C

## 2023-05-02 ENCOUNTER — Ambulatory Visit: Payer: Medicare HMO | Admitting: Cardiology

## 2023-05-02 DIAGNOSIS — U071 COVID-19: Secondary | ICD-10-CM | POA: Diagnosis not present

## 2023-05-02 DIAGNOSIS — R509 Fever, unspecified: Secondary | ICD-10-CM | POA: Diagnosis not present

## 2023-05-02 DIAGNOSIS — J069 Acute upper respiratory infection, unspecified: Secondary | ICD-10-CM | POA: Diagnosis not present

## 2023-05-02 DIAGNOSIS — Z03818 Encounter for observation for suspected exposure to other biological agents ruled out: Secondary | ICD-10-CM | POA: Diagnosis not present

## 2023-05-02 DIAGNOSIS — Z6826 Body mass index (BMI) 26.0-26.9, adult: Secondary | ICD-10-CM | POA: Diagnosis not present

## 2023-06-05 NOTE — Progress Notes (Unsigned)
Cardiology Office Note    Date:  06/06/2023  ID:  Kristopher Flores, DOB 1945/09/13, MRN [id number] PCP:  Soundra Pilon, FNP  Cardiologist:  Reatha Harps, MD  Electrophysiologist:  None   Chief Complaint: Follow up aortic aneurysm and advanced coronary artery calcifications on CT, chest discomfort   History of Present Illness: .    Kristopher Flores is a 77 y.o. 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   First evaluated by Dr. Lalla Brothers in 05/2020 following an episode of syncope considering staying of nausea, lightheadedness and cold sweats.  His symptoms had lasted for several hours.  It was recommended that he undergo ETT (not completed) and echocardiogram.  His echo showed a moderate aortic regurgitation with eccentric jet severely dilated ascending aortic aneurysm with maximum diameter 52 mm.  LVEF of 66 5%, no RWMA, mild concentric left LVH G1 DD.  He also underwent home sleep study which showed no evidence of's obstructive sleep apnea.  In setting of severely dilated ascending aortic aneurysm he underwent CT angio of chest and aorta which showed dilated ascending thoracic aorta measuring approximately 5.1 cm at the greatest diameter.  Repeat CT angio in 12/2020 showed stable fusiform aneurysm of the ascending thoracic aorta at 5.1 cm.  Echocardiogram 01/08/2022 showed LVEF 60 to 65%, LV with no RWMA, mild concentric left ventricular hypertrophy G1 DD.  Moderate calcification of the aortic valve with moderate aortic valve regurgitation.  AV sclerosis without evidence of stenosis.  There was severe dilation of the ascending aorta measuring 54 mm.  With noted increase in dilation of ascending aorta he had a CTA aorta showed stable fusiform aneurysm of the ascending aorta at 5.1 cm, ascending thoracic aortic aneurysm.  Stable small left lower lobe pulmonary nodules, aortic and advanced coronary  artery atherosclerotic calcifications.  He was last seen in clinic on 04/16/2022 by Dr.O'Neal.  He had remained stable from a cardiac perspective.  Today he presents for follow-up.  He reports that he has been doing well.  He does report 3 episodes of chest discomfort a few months ago that lasted for 30 minutes to an hour each that he did not associate with exertion but resolved with deep breathing. Last episode was 2 months ago. He denies shortness of breath, lower extremity edema or palpitations.  Reports that he went golfing this weekend and tolerated well.  Labwork independently reviewed: 03/23/2023: Hemoglobin 15.3, creatinine 1.1, potassium 4.5, ALT 12 09/22/2022: Total cholesterol 169, HDL 51, triglycerides 113, LDL 98, TSH 3.18 ROS: .   Today he denies shortness of breath, lower extremity edema, fatigue, palpitations, melena, hematuria, hemoptysis, diaphoresis, weakness, presyncope, syncope, orthopnea, and PND.  All other systems are reviewed and otherwise negative. Studies Reviewed: Marland Kitchen    EKG:  EKG is ordered today, personally reviewed, demonstrating  EKG Interpretation Date/Time:  Monday June 06 2023 15:15:07 EDT Ventricular Rate:  58 PR Interval:  182 QRS Duration:  84 QT Interval:  414 QTC Calculation: 406 R Axis:   57  Text Interpretation: Sinus bradycardia Biventricular hypertrophy Confirmed by Reather Littler [phone]) on 06/06/2023 4:09:49 PM    CV Studies:  Cardiac Studies & Procedures       ECHOCARDIOGRAM  ECHOCARDIOGRAM COMPLETE 01/08/2022  Narrative ECHOCARDIOGRAM REPORT    Patient Name:   Kristopher Flores Date of Exam: 01/08/2022 Medical Rec #:  [id number]       Height:  70.0 in Accession #:    [id number]      Weight:       185.6 lb Date of Birth:  [birth year]       BSA:          2.022 m Patient Age:    75 years        BP:           128/72 mmHg Patient Gender: M               HR:           56 bpm. Exam Location:  [address]  Procedure: 2D Echo, 3D Echo,  Cardiac Doppler, Color Doppler and Strain Analysis  Indications:    I71.21 Ascending aortic aneurysm  History:        Patient has prior history of Echocardiogram examinations, most recent 06/10/2020. Risk Factors:Hypertension and Dyslipidemia. Ascending aortic aneurysm.  Sonographer:    Jorje Guild BS, RDCS Referring Phys: [id number] Ronnald Ramp O'NEAL  IMPRESSIONS   1. Left ventricular ejection fraction, by estimation, is 60 to 65%. Left ventricular ejection fraction by 3D volume is 62 %. The left ventricle has normal function. The left ventricle has no regional wall motion abnormalities. There is mild concentric left ventricular hypertrophy. Left ventricular diastolic parameters are consistent with Grade I diastolic dysfunction (impaired relaxation). The average left ventricular global longitudinal strain is -23.3 %. The global longitudinal strain is normal. 2. Right ventricular systolic function is normal. The right ventricular size is normal. There is normal pulmonary artery systolic pressure. The estimated right ventricular systolic pressure is 28.4 mmHg. 3. The mitral valve is normal in structure. No evidence of mitral valve regurgitation. No evidence of mitral stenosis. 4. The aortic valve is tricuspid. There is moderate calcification of the aortic valve. There is mild thickening of the aortic valve. Aortic valve regurgitation is moderate, but eccentric. Aortic valve sclerosis is present, with no evidence of aortic valve stenosis. There is no diastolic flow reversal. 5. Aortic dilatation noted. There is severe dilatation of the ascending aorta, measuring 54 mm. 6. The inferior vena cava is dilated in size with >50% respiratory variability, suggesting right atrial pressure of 8 mmHg.  Comparison(s): Prior images reviewed side by side. 06/10/20 EF 60-65%. Moderate AI. Ascending aorta 52mm. Slight increase in ascend aortic size with no change in LV size or function.  FINDINGS Left  Ventricle: Left ventricular ejection fraction, by estimation, is 60 to 65%. Left ventricular ejection fraction by 3D volume is 62 %. The left ventricle has normal function. The left ventricle has no regional wall motion abnormalities. The average left ventricular global longitudinal strain is -23.3 %. The global longitudinal strain is normal. The left ventricular internal cavity size was normal in size. There is mild concentric left ventricular hypertrophy. Left ventricular diastolic parameters are consistent with Grade I diastolic dysfunction (impaired relaxation).  Right Ventricle: The right ventricular size is normal. No increase in right ventricular wall thickness. Right ventricular systolic function is normal. There is normal pulmonary artery systolic pressure. The tricuspid regurgitant velocity is 2.26 m/s, and with an assumed right atrial pressure of 8 mmHg, the estimated right ventricular systolic pressure is 28.4 mmHg.  Left Atrium: Left atrial size was normal in size.  Right Atrium: Right atrial size was normal in size.  Pericardium: There is no evidence of pericardial effusion.  Mitral Valve: The mitral valve is normal in structure. No evidence of mitral valve regurgitation. No evidence of mitral valve stenosis.  70.0 in Accession #:    [id number]      Weight:       185.6 lb Date of Birth:  [birth year]       BSA:          2.022 m Patient Age:    75 years        BP:           128/72 mmHg Patient Gender: M               HR:           56 bpm. Exam Location:  [address]  Procedure: 2D Echo, 3D Echo,  Cardiac Doppler, Color Doppler and Strain Analysis  Indications:    I71.21 Ascending aortic aneurysm  History:        Patient has prior history of Echocardiogram examinations, most recent 06/10/2020. Risk Factors:Hypertension and Dyslipidemia. Ascending aortic aneurysm.  Sonographer:    Jorje Guild BS, RDCS Referring Phys: [id number] Ronnald Ramp O'NEAL  IMPRESSIONS   1. Left ventricular ejection fraction, by estimation, is 60 to 65%. Left ventricular ejection fraction by 3D volume is 62 %. The left ventricle has normal function. The left ventricle has no regional wall motion abnormalities. There is mild concentric left ventricular hypertrophy. Left ventricular diastolic parameters are consistent with Grade I diastolic dysfunction (impaired relaxation). The average left ventricular global longitudinal strain is -23.3 %. The global longitudinal strain is normal. 2. Right ventricular systolic function is normal. The right ventricular size is normal. There is normal pulmonary artery systolic pressure. The estimated right ventricular systolic pressure is 28.4 mmHg. 3. The mitral valve is normal in structure. No evidence of mitral valve regurgitation. No evidence of mitral stenosis. 4. The aortic valve is tricuspid. There is moderate calcification of the aortic valve. There is mild thickening of the aortic valve. Aortic valve regurgitation is moderate, but eccentric. Aortic valve sclerosis is present, with no evidence of aortic valve stenosis. There is no diastolic flow reversal. 5. Aortic dilatation noted. There is severe dilatation of the ascending aorta, measuring 54 mm. 6. The inferior vena cava is dilated in size with >50% respiratory variability, suggesting right atrial pressure of 8 mmHg.  Comparison(s): Prior images reviewed side by side. 06/10/20 EF 60-65%. Moderate AI. Ascending aorta 52mm. Slight increase in ascend aortic size with no change in LV size or function.  FINDINGS Left  Ventricle: Left ventricular ejection fraction, by estimation, is 60 to 65%. Left ventricular ejection fraction by 3D volume is 62 %. The left ventricle has normal function. The left ventricle has no regional wall motion abnormalities. The average left ventricular global longitudinal strain is -23.3 %. The global longitudinal strain is normal. The left ventricular internal cavity size was normal in size. There is mild concentric left ventricular hypertrophy. Left ventricular diastolic parameters are consistent with Grade I diastolic dysfunction (impaired relaxation).  Right Ventricle: The right ventricular size is normal. No increase in right ventricular wall thickness. Right ventricular systolic function is normal. There is normal pulmonary artery systolic pressure. The tricuspid regurgitant velocity is 2.26 m/s, and with an assumed right atrial pressure of 8 mmHg, the estimated right ventricular systolic pressure is 28.4 mmHg.  Left Atrium: Left atrial size was normal in size.  Right Atrium: Right atrial size was normal in size.  Pericardium: There is no evidence of pericardial effusion.  Mitral Valve: The mitral valve is normal in structure. No evidence of mitral valve regurgitation. No evidence of mitral valve stenosis.  70.0 in Accession #:    [id number]      Weight:       185.6 lb Date of Birth:  [birth year]       BSA:          2.022 m Patient Age:    75 years        BP:           128/72 mmHg Patient Gender: M               HR:           56 bpm. Exam Location:  [address]  Procedure: 2D Echo, 3D Echo,  Cardiac Doppler, Color Doppler and Strain Analysis  Indications:    I71.21 Ascending aortic aneurysm  History:        Patient has prior history of Echocardiogram examinations, most recent 06/10/2020. Risk Factors:Hypertension and Dyslipidemia. Ascending aortic aneurysm.  Sonographer:    Jorje Guild BS, RDCS Referring Phys: [id number] Ronnald Ramp O'NEAL  IMPRESSIONS   1. Left ventricular ejection fraction, by estimation, is 60 to 65%. Left ventricular ejection fraction by 3D volume is 62 %. The left ventricle has normal function. The left ventricle has no regional wall motion abnormalities. There is mild concentric left ventricular hypertrophy. Left ventricular diastolic parameters are consistent with Grade I diastolic dysfunction (impaired relaxation). The average left ventricular global longitudinal strain is -23.3 %. The global longitudinal strain is normal. 2. Right ventricular systolic function is normal. The right ventricular size is normal. There is normal pulmonary artery systolic pressure. The estimated right ventricular systolic pressure is 28.4 mmHg. 3. The mitral valve is normal in structure. No evidence of mitral valve regurgitation. No evidence of mitral stenosis. 4. The aortic valve is tricuspid. There is moderate calcification of the aortic valve. There is mild thickening of the aortic valve. Aortic valve regurgitation is moderate, but eccentric. Aortic valve sclerosis is present, with no evidence of aortic valve stenosis. There is no diastolic flow reversal. 5. Aortic dilatation noted. There is severe dilatation of the ascending aorta, measuring 54 mm. 6. The inferior vena cava is dilated in size with >50% respiratory variability, suggesting right atrial pressure of 8 mmHg.  Comparison(s): Prior images reviewed side by side. 06/10/20 EF 60-65%. Moderate AI. Ascending aorta 52mm. Slight increase in ascend aortic size with no change in LV size or function.  FINDINGS Left  Ventricle: Left ventricular ejection fraction, by estimation, is 60 to 65%. Left ventricular ejection fraction by 3D volume is 62 %. The left ventricle has normal function. The left ventricle has no regional wall motion abnormalities. The average left ventricular global longitudinal strain is -23.3 %. The global longitudinal strain is normal. The left ventricular internal cavity size was normal in size. There is mild concentric left ventricular hypertrophy. Left ventricular diastolic parameters are consistent with Grade I diastolic dysfunction (impaired relaxation).  Right Ventricle: The right ventricular size is normal. No increase in right ventricular wall thickness. Right ventricular systolic function is normal. There is normal pulmonary artery systolic pressure. The tricuspid regurgitant velocity is 2.26 m/s, and with an assumed right atrial pressure of 8 mmHg, the estimated right ventricular systolic pressure is 28.4 mmHg.  Left Atrium: Left atrial size was normal in size.  Right Atrium: Right atrial size was normal in size.  Pericardium: There is no evidence of pericardial effusion.  Mitral Valve: The mitral valve is normal in structure. No evidence of mitral valve regurgitation. No evidence of mitral valve stenosis.  Cardiology Office Note    Date:  06/06/2023  ID:  Kristopher Flores, DOB 1945/09/13, MRN [id number] PCP:  Soundra Pilon, FNP  Cardiologist:  Reatha Harps, MD  Electrophysiologist:  None   Chief Complaint: Follow up aortic aneurysm and advanced coronary artery calcifications on CT, chest discomfort   History of Present Illness: .    Kristopher Flores is a 77 y.o. 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   First evaluated by Dr. Lalla Brothers in 05/2020 following an episode of syncope considering staying of nausea, lightheadedness and cold sweats.  His symptoms had lasted for several hours.  It was recommended that he undergo ETT (not completed) and echocardiogram.  His echo showed a moderate aortic regurgitation with eccentric jet severely dilated ascending aortic aneurysm with maximum diameter 52 mm.  LVEF of 66 5%, no RWMA, mild concentric left LVH G1 DD.  He also underwent home sleep study which showed no evidence of's obstructive sleep apnea.  In setting of severely dilated ascending aortic aneurysm he underwent CT angio of chest and aorta which showed dilated ascending thoracic aorta measuring approximately 5.1 cm at the greatest diameter.  Repeat CT angio in 12/2020 showed stable fusiform aneurysm of the ascending thoracic aorta at 5.1 cm.  Echocardiogram 01/08/2022 showed LVEF 60 to 65%, LV with no RWMA, mild concentric left ventricular hypertrophy G1 DD.  Moderate calcification of the aortic valve with moderate aortic valve regurgitation.  AV sclerosis without evidence of stenosis.  There was severe dilation of the ascending aorta measuring 54 mm.  With noted increase in dilation of ascending aorta he had a CTA aorta showed stable fusiform aneurysm of the ascending aorta at 5.1 cm, ascending thoracic aortic aneurysm.  Stable small left lower lobe pulmonary nodules, aortic and advanced coronary  artery atherosclerotic calcifications.  He was last seen in clinic on 04/16/2022 by Dr.O'Neal.  He had remained stable from a cardiac perspective.  Today he presents for follow-up.  He reports that he has been doing well.  He does report 3 episodes of chest discomfort a few months ago that lasted for 30 minutes to an hour each that he did not associate with exertion but resolved with deep breathing. Last episode was 2 months ago. He denies shortness of breath, lower extremity edema or palpitations.  Reports that he went golfing this weekend and tolerated well.  Labwork independently reviewed: 03/23/2023: Hemoglobin 15.3, creatinine 1.1, potassium 4.5, ALT 12 09/22/2022: Total cholesterol 169, HDL 51, triglycerides 113, LDL 98, TSH 3.18 ROS: .   Today he denies shortness of breath, lower extremity edema, fatigue, palpitations, melena, hematuria, hemoptysis, diaphoresis, weakness, presyncope, syncope, orthopnea, and PND.  All other systems are reviewed and otherwise negative. Studies Reviewed: Marland Kitchen    EKG:  EKG is ordered today, personally reviewed, demonstrating  EKG Interpretation Date/Time:  Monday June 06 2023 15:15:07 EDT Ventricular Rate:  58 PR Interval:  182 QRS Duration:  84 QT Interval:  414 QTC Calculation: 406 R Axis:   57  Text Interpretation: Sinus bradycardia Biventricular hypertrophy Confirmed by Reather Littler [phone]) on 06/06/2023 4:09:49 PM    CV Studies:  Cardiac Studies & Procedures       ECHOCARDIOGRAM  ECHOCARDIOGRAM COMPLETE 01/08/2022  Narrative ECHOCARDIOGRAM REPORT    Patient Name:   Kristopher Flores Date of Exam: 01/08/2022 Medical Rec #:  [id number]       Height:

## 2023-06-06 ENCOUNTER — Encounter: Payer: Self-pay | Admitting: Nurse Practitioner

## 2023-06-06 ENCOUNTER — Other Ambulatory Visit: Payer: Self-pay

## 2023-06-06 ENCOUNTER — Ambulatory Visit: Payer: Medicare HMO | Attending: Cardiology | Admitting: Cardiology

## 2023-06-06 VITALS — BP 124/70 | HR 58 | Ht 70.0 in | Wt 183.6 lb

## 2023-06-06 DIAGNOSIS — R072 Precordial pain: Secondary | ICD-10-CM

## 2023-06-06 DIAGNOSIS — E782 Mixed hyperlipidemia: Secondary | ICD-10-CM

## 2023-06-06 DIAGNOSIS — I351 Nonrheumatic aortic (valve) insufficiency: Secondary | ICD-10-CM

## 2023-06-06 DIAGNOSIS — I2510 Atherosclerotic heart disease of native coronary artery without angina pectoris: Secondary | ICD-10-CM

## 2023-06-06 DIAGNOSIS — I10 Essential (primary) hypertension: Secondary | ICD-10-CM

## 2023-06-06 DIAGNOSIS — I7121 Aneurysm of the ascending aorta, without rupture: Secondary | ICD-10-CM

## 2023-06-06 MED ORDER — ROSUVASTATIN CALCIUM 10 MG PO TABS: 10.0000 mg | ORAL_TABLET | Freq: Every day | ORAL | 3 refills | Status: DC

## 2023-06-06 NOTE — Patient Instructions (Signed)
Medication Instructions:  STOP Pravastatin as directed. Start Crestor 10 mg daily.  *If you need a refill on your cardiac medications before your next appointment, please call your pharmacy*   Lab Work: Fasting lipid panel & CMET Thursday June 09, 2023.  Repeat Fasting Lipid panel & LFTs in 8 weeks.   Testing/Procedures: Non-Cardiac CT Angiography (CTA), is a special type of CT scan that uses a computer to produce multi-dimensional views of major blood vessels throughout the body. In CT angiography, a contrast material is injected through an IV to help visualize the blood vessels   How to Prepare for Your Cardiac PET/CT Stress Test:  1. Please do not take these medications before your test:   Medications that may interfere with the cardiac pharmacological stress agent (ex. nitrates - including erectile dysfunction medications, isosorbide mononitrate, tamulosin or beta-blockers) the day of the exam. (Erectile dysfunction medication should be held for at least 72 hrs prior to test) Theophylline containing medications for 12 hours. Dipyridamole 48 hours prior to the test. Your remaining medications may be taken with water.  2. Nothing to eat or drink, except water, 3 hours prior to arrival time.   NO caffeine/decaffeinated products, or chocolate 12 hours prior to arrival.  3. NO perfume, cologne or lotion on chest or abdomen area.          - FEMALES - Please avoid wearing dresses to this appointment.  4. Total time is 1 to 2 hours; you may want to bring reading material for the waiting time.  5. Please report to Radiology at the [facility] Main Entrance 30 minutes early for your test.  [address] [place], [place] [address]  6. Please report to Radiology at [facility] Main Entrance, medical mall, 30 mins prior to your test.  [address]  [place], [place]  [phone]  Diabetic Preparation:  Hold oral medications. You may take  NPH and Lantus insulin. Do not take Humalog or Humulin R (Regular Insulin) the day of your test. Check blood sugars prior to leaving the house. If able to eat breakfast prior to 3 hour fasting, you may take all medications, including your insulin, Do not worry if you miss your breakfast dose of insulin - start at your next meal. Patients who wear a continuous glucose monitor MUST remove the device prior to scanning.  IF YOU THINK YOU MAY BE PREGNANT, OR ARE NURSING PLEASE INFORM THE TECHNOLOGIST.  In preparation for your appointment, medication and supplies will be purchased.  Appointment availability is limited, so if you need to cancel or reschedule, please call the Radiology Department at [phone] [facility]) OR [phone] [facility])  24 hours in advance to avoid a cancellation fee of $100.00  What to Expect After you Arrive:  Once you arrive and check in for your appointment, you will be taken to a preparation room within the Radiology Department.  A technologist or Nurse will obtain your medical history, verify that you are correctly prepped for the exam, and explain the procedure.  Afterwards,  an IV will be started in your arm and electrodes will be placed on your skin for EKG monitoring during the stress portion of the exam. Then you will be escorted to the PET/CT scanner.  There, staff will get you positioned on the scanner and obtain a blood pressure and EKG.  During the exam, you will continue to be connected to the EKG and blood pressure machines.  A small, safe  amount of a radioactive tracer will be injected in your IV to obtain a series of pictures of your heart along with an injection of a stress agent.    After your Exam:  It is recommended that you eat a meal and drink a caffeinated beverage to counter act any effects of the stress agent.  Drink plenty of fluids for the remainder of the day and urinate frequently for the first couple of hours after the exam.  Your doctor will  inform you of your test results within 7-10 business days.  For more information and frequently asked questions, please visit our website : [email or web address]  For questions about your test or how to prepare for your test, please call: Cardiac Imaging Nurse Navigators Office: [phone]    Follow-Up: At [facility], you and your health needs are our priority.  As part of our continuing mission to provide you with exceptional heart care, we have created designated Provider Care Teams.  These Care Teams include your primary Cardiologist (physician) and Advanced Practice Providers (APPs -  Physician Assistants and Nurse Practitioners) who all work together to provide you with the care you need, when you need it.  We recommend signing up for the patient portal called "MyChart".  Sign up information is provided on this After Visit Summary.  MyChart is used to connect with patients for Virtual Visits (Telemedicine).  Patients are able to view lab/test results, encounter notes, upcoming appointments, etc.  Non-urgent messages can be sent to your provider as well.   To learn more about what you can do with MyChart, go to [email or web address].    Your next appointment:   2 month(s)  Provider:   Reather Littler, NP        Other Instructions

## 2023-06-09 DIAGNOSIS — I2510 Atherosclerotic heart disease of native coronary artery without angina pectoris: Secondary | ICD-10-CM | POA: Diagnosis not present

## 2023-06-09 DIAGNOSIS — R072 Precordial pain: Secondary | ICD-10-CM | POA: Diagnosis not present

## 2023-06-09 DIAGNOSIS — I7121 Aneurysm of the ascending aorta, without rupture: Secondary | ICD-10-CM | POA: Diagnosis not present

## 2023-06-09 DIAGNOSIS — E782 Mixed hyperlipidemia: Secondary | ICD-10-CM | POA: Diagnosis not present

## 2023-06-09 DIAGNOSIS — I351 Nonrheumatic aortic (valve) insufficiency: Secondary | ICD-10-CM | POA: Diagnosis not present

## 2023-06-09 LAB — COMPREHENSIVE METABOLIC PANEL WITH GFR
ALT: 11 [IU]/L (ref 0–44)
AST: 18 [IU]/L (ref 0–40)
Albumin: 4.4 g/dL (ref 3.8–4.8)
Alkaline Phosphatase: 77 [IU]/L (ref 44–121)
BUN/Creatinine Ratio: 14 (ref 10–24)
BUN: 15 mg/dL (ref 8–27)
Bilirubin Total: 0.7 mg/dL (ref 0.0–1.2)
CO2: 25 mmol/L (ref 20–29)
Calcium: 9.4 mg/dL (ref 8.6–10.2)
Chloride: 100 mmol/L (ref 96–106)
Creatinine, Ser: 1.09 mg/dL (ref 0.76–1.27)
Globulin, Total: 2 g/dL (ref 1.5–4.5)
Glucose: 100 mg/dL — ABNORMAL HIGH (ref 70–99)
Potassium: 4.7 mmol/L (ref 3.5–5.2)
Sodium: 137 mmol/L (ref 134–144)
Total Protein: 6.4 g/dL (ref 6.0–8.5)
eGFR: 70 mL/min/{1.73_m2}

## 2023-06-09 LAB — HEPATIC FUNCTION PANEL
ALT: 14 [IU]/L (ref 0–44)
AST: 19 [IU]/L (ref 0–40)
Albumin: 4.4 g/dL (ref 3.8–4.8)
Alkaline Phosphatase: 77 [IU]/L (ref 44–121)
Bilirubin Total: 0.8 mg/dL (ref 0.0–1.2)
Bilirubin, Direct: 0.22 mg/dL (ref 0.00–0.40)
Total Protein: 6.5 g/dL (ref 6.0–8.5)

## 2023-06-09 LAB — LIPID PANEL
Chol/HDL Ratio: 2.8 ratio (ref 0.0–5.0)
Cholesterol, Total: 175 mg/dL (ref 100–199)
HDL: 62 mg/dL
LDL Chol Calc (NIH): 94 mg/dL (ref 0–99)
Triglycerides: 108 mg/dL (ref 0–149)
VLDL Cholesterol Cal: 19 mg/dL (ref 5–40)

## 2023-06-14 ENCOUNTER — Ambulatory Visit (HOSPITAL_COMMUNITY): Payer: Medicare HMO

## 2023-06-16 ENCOUNTER — Ambulatory Visit (HOSPITAL_COMMUNITY)
Admission: RE | Admit: 2023-06-16 | Discharge: 2023-06-16 | Disposition: A | Discharge status: Home / Self Care | Payer: Medicare HMO | Source: Ambulatory Visit | Attending: Cardiology | Admitting: Cardiology

## 2023-06-16 DIAGNOSIS — I700 Atherosclerosis of aorta: Secondary | ICD-10-CM | POA: Diagnosis not present

## 2023-06-16 DIAGNOSIS — R935 Abnormal findings on diagnostic imaging of other abdominal regions, including retroperitoneum: Secondary | ICD-10-CM | POA: Diagnosis not present

## 2023-06-16 DIAGNOSIS — I7121 Aneurysm of the ascending aorta, without rupture: Secondary | ICD-10-CM | POA: Insufficient documentation

## 2023-06-16 DIAGNOSIS — I2510 Atherosclerotic heart disease of native coronary artery without angina pectoris: Secondary | ICD-10-CM | POA: Diagnosis not present

## 2023-06-16 MED ORDER — IOHEXOL 350 MG/ML SOLN
75.0000 mL | Freq: Once | INTRAVENOUS | Status: AC | PRN
  Administered 2023-06-16: 75 mL via INTRAVENOUS

## 2023-06-28 ENCOUNTER — Telehealth: Payer: Self-pay | Admitting: Cardiovascular Disease

## 2023-06-28 NOTE — Telephone Encounter (Signed)
Patient called to follow-up on his CT test results.

## 2023-06-28 NOTE — Telephone Encounter (Signed)
Will route to provider. CT has not been reviewed yet.

## 2023-06-29 NOTE — Telephone Encounter (Signed)
Called and discussed CT scan with Kristopher Flores.  Ascending aorta remains stable at 52 mm.  Has been 51 mm for the past 3 years.  No significant change in my opinion.  We discussed that the indications of surgery would be 55 mm.  He has no risk factors or worrisome features.  For now we will continue with routine surveillance.  He does have some coronary calcium noted on his CT scan.  Transition to Crestor.  He reports no chest pain or trouble breathing.  I informed him it is okay to hold on a stress test.  He will see me on 08/11/2023 and we will discuss further.  Gerri Spore T. Flora Lipps, MD, [facility]  [facility]  [address] [place], [place] [address] [phone]  2:24 PM

## 2023-08-10 NOTE — Progress Notes (Unsigned)
Cardiology Office Note:  .   Date:  08/11/2023  ID:  Kristopher Flores, DOB 1946/05/25, MRN [id number] PCP: Soundra Pilon, FNP  [facility] Providers Cardiologist:  Reatha Harps, MD    History of Present Illness: .   Kristopher Flores is a 77 y.o. male with history of Asc Ao aneurysm, moderate AI who presents for follow-up.    History of Present Illness   The patient is a 77 year old male with a known history of an ascending aortic aneurysm, coronary calcifications, and hyperlipidemia. The size of the aortic aneurysm has been stable over the past three years, currently measuring 52 mm. 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 He is on lisinopril for blood pressure control, aspirin, and Crestor for cholesterol management. The patient reports feeling good overall and has not experienced any chest pain. He has recently changed his daily vitamin to a brand called [employer], which he believes is beneficial. The patient's blood pressure was slightly elevated during the visit, but he attributes this to stress and rushing to get to the appointment.          Problem List 1. HTN 2. HLD -T chol 132, HDL 53, LDL 58, triglycerides 119 3. Ascending aortic aneurysm -Tricuspid AoV -06/27/2020: 51 mm -12/29/2020: 51 mm -03/30/2022: 51 mm  -06/24/2023: 52 mm  4. Moderate AI 5. Coronary calcifications 6. 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 Pertinent positives noted in the HPI.     Studies Reviewed: Marland Kitchen        TTE 01/08/2022  1. Left ventricular ejection fraction, by estimation, is 60 to 65%. Left  ventricular ejection fraction by 3D volume is 62 %. The left ventricle has  normal function. The left ventricle has no regional wall motion  abnormalities. There is mild concentric  left ventricular hypertrophy. Left ventricular  diastolic parameters are  consistent with Grade I diastolic dysfunction (impaired relaxation). The  average left ventricular global longitudinal strain is -23.3 %. The global  longitudinal strain is normal.   2. Right ventricular systolic function is normal. The right ventricular  size is normal. There is normal pulmonary artery systolic pressure. The  estimated right ventricular systolic pressure is 28.4 mmHg.   3. The mitral valve is normal in structure. No evidence of mitral valve  regurgitation. No evidence of mitral stenosis.   4. The aortic valve is tricuspid. There is moderate calcification of the  aortic valve. There is mild thickening of the aortic valve. Aortic valve  regurgitation is moderate, but eccentric. Aortic valve sclerosis is  present, with no evidence of aortic  valve stenosis. There is no diastolic flow reversal.   5. Aortic dilatation noted. There is severe dilatation of the ascending  aorta, measuring 54 mm.   6. The inferior vena cava is dilated in size with >50% respiratory  variability, suggesting right atrial pressure of 8 mmHg.  Physical Exam:   VS:  BP 130/66 (BP Location: Left Arm, Patient Position: Sitting, Cuff Size: Normal)   Pulse 62   Ht 5\' 10"  (1.778 m)   Wt 181 lb (82.1 kg)   SpO2 95%   BMI 25.97 kg/m    Wt Readings from Last 3 Encounters:  08/11/23 181 lb (82.1 kg)  06/06/23 183 lb 9.6 oz (83.3 kg)  04/16/22 192 lb 3.2 oz (87.2 kg)    GEN:  Well nourished, well developed in no acute distress NECK: No JVD; No carotid bruits CARDIAC: RRR, no murmurs, rubs, gallops RESPIRATORY:  Clear to auscultation without rales, wheezing or rhonchi  ABDOMEN: Soft, non-tender, non-distended EXTREMITIES:  No edema; No deformity  ASSESSMENT AND PLAN: .   Assessment and Plan    Ascending Aortic Aneurysm Stable at 52mm over the past three years, below the threshold for repair (55mm). No symptoms reported. -Continue monitoring, repeat gated aorta CT scan in 1  year.  Hypertension Blood pressure slightly elevated during visit, but reported as well-controlled at home. -Continue Lisinopril 20mg  daily, aim for BP <130/80.  Coronary Calcifications and Hyperlipidemia LDL at goal (94) on current medication. -Continue Aspirin and Crestor 10mg  daily.  General Health Maintenance -Encouraged to maintain active lifestyle. -Return for follow-up in 1 year with a gated aorta CT scan prior to the appointment.              Follow-up: Return in about 1 year (around 08/10/2024).  Time Spent with Patient: I have spent a total of 25 minutes caring for this patient today face to face, ordering and reviewing labs/tests, reviewing prior records/medical history, examining the patient, establishing an assessment and plan, communicating results/findings to the patient/family, and documenting in the medical record.   Signed, Lenna Gilford. Flora Lipps, MD, [facility]  [facility]  [address], [address] [place], [place] [address] [phone]  2:47 PM

## 2023-08-11 ENCOUNTER — Ambulatory Visit: Payer: Medicare HMO | Attending: Cardiovascular Disease | Admitting: Cardiovascular Disease

## 2023-08-11 ENCOUNTER — Encounter: Payer: Self-pay | Admitting: Cardiovascular Disease

## 2023-08-11 VITALS — BP 130/66 | HR 62 | Ht 70.0 in | Wt 181.0 lb

## 2023-08-11 DIAGNOSIS — I2510 Atherosclerotic heart disease of native coronary artery without angina pectoris: Secondary | ICD-10-CM

## 2023-08-11 DIAGNOSIS — E782 Mixed hyperlipidemia: Secondary | ICD-10-CM | POA: Diagnosis not present

## 2023-08-11 DIAGNOSIS — I351 Nonrheumatic aortic (valve) insufficiency: Secondary | ICD-10-CM | POA: Diagnosis not present

## 2023-08-11 DIAGNOSIS — I7121 Aneurysm of the ascending aorta, without rupture: Secondary | ICD-10-CM | POA: Diagnosis not present

## 2023-08-11 DIAGNOSIS — I10 Essential (primary) hypertension: Secondary | ICD-10-CM

## 2023-08-11 NOTE — Patient Instructions (Signed)
Medication Instructions:  Your physician recommends that you continue on your current medications as directed. Please refer to the Current Medication list given to you today.    *If you need a refill on your cardiac medications before your next appointment, please call your pharmacy*   Lab Work: None    If you have labs (blood work) drawn today and your tests are completely normal, you will receive your results only by: MyChart Message (if you have MyChart) OR A paper copy in the mail If you have any lab test that is abnormal or we need to change your treatment, we will call you to review the results.   Testing/Procedures: WILL BE SCHEDULED IN DECEMBER 2025 BEFORE NEXT APPOINTMENT.  Non-Cardiac CT Angiography (CTA), is a special type of CT scan that uses a computer to produce multi-dimensional views of major blood vessels throughout the body, chest and aorta. In CT angiography, a contrast material is injected through an IV to help visualize the blood vessels     Follow-Up: At [facility], you and your health needs are our priority.  As part of our continuing mission to provide you with exceptional heart care, we have created designated Provider Care Teams.  These Care Teams include your primary Cardiologist (physician) and Advanced Practice Providers (APPs -  Physician Assistants and Nurse Practitioners) who all work together to provide you with the care you need, when you need it.  We recommend signing up for the patient portal called "MyChart".  Sign up information is provided on this After Visit Summary.  MyChart is used to connect with patients for Virtual Visits (Telemedicine).  Patients are able to view lab/test results, encounter notes, upcoming appointments, etc.  Non-urgent messages can be sent to your provider as well.   To learn more about what you can do with MyChart, go to [email or web address].    Your next appointment:   1 year(s)  The format for your next  appointment:   In Person  Provider:   Reatha Harps, MD    Other Instructions

## 2023-11-17 ENCOUNTER — Emergency Department (HOSPITAL_COMMUNITY)

## 2023-11-17 ENCOUNTER — Emergency Department (HOSPITAL_COMMUNITY): Admitting: Anesthesiology

## 2023-11-17 ENCOUNTER — Other Ambulatory Visit: Payer: Self-pay

## 2023-11-17 ENCOUNTER — Encounter (HOSPITAL_COMMUNITY): Payer: Self-pay | Admitting: Emergency Medicine

## 2023-11-17 ENCOUNTER — Inpatient Hospital Stay (HOSPITAL_COMMUNITY)
Admission: EM | Disposition: A | Discharge status: Home / Self Care | Payer: Self-pay | Source: Home / Self Care | Attending: Thoracic Surgery (Cardiothoracic Vascular Surgery)

## 2023-11-17 ENCOUNTER — Inpatient Hospital Stay (HOSPITAL_COMMUNITY)
Admission: EM | Admit: 2023-11-17 | Discharge: 2023-11-24 | DRG: 220 | Disposition: A | Discharge status: Home / Self Care | Attending: Thoracic Surgery (Cardiothoracic Vascular Surgery) | Admitting: Thoracic Surgery (Cardiothoracic Vascular Surgery)

## 2023-11-17 ENCOUNTER — Inpatient Hospital Stay (HOSPITAL_COMMUNITY)

## 2023-11-17 DIAGNOSIS — Z9889 Other specified postprocedural states: Secondary | ICD-10-CM | POA: Diagnosis not present

## 2023-11-17 DIAGNOSIS — Z452 Encounter for adjustment and management of vascular access device: Secondary | ICD-10-CM | POA: Diagnosis not present

## 2023-11-17 DIAGNOSIS — I7101 Dissection of ascending aorta: Secondary | ICD-10-CM

## 2023-11-17 DIAGNOSIS — I7100 Dissection of unspecified site of aorta: Principal | ICD-10-CM

## 2023-11-17 DIAGNOSIS — K5730 Diverticulosis of large intestine without perforation or abscess without bleeding: Secondary | ICD-10-CM | POA: Diagnosis not present

## 2023-11-17 DIAGNOSIS — I7121 Aneurysm of the ascending aorta, without rupture: Secondary | ICD-10-CM | POA: Diagnosis present

## 2023-11-17 DIAGNOSIS — N281 Cyst of kidney, acquired: Secondary | ICD-10-CM | POA: Diagnosis not present

## 2023-11-17 DIAGNOSIS — I3481 Nonrheumatic mitral (valve) annulus calcification: Secondary | ICD-10-CM | POA: Diagnosis not present

## 2023-11-17 DIAGNOSIS — J90 Pleural effusion, not elsewhere classified: Secondary | ICD-10-CM | POA: Diagnosis not present

## 2023-11-17 DIAGNOSIS — I088 Other rheumatic multiple valve diseases: Secondary | ICD-10-CM | POA: Diagnosis not present

## 2023-11-17 DIAGNOSIS — E7800 Pure hypercholesterolemia, unspecified: Secondary | ICD-10-CM | POA: Diagnosis not present

## 2023-11-17 DIAGNOSIS — I2510 Atherosclerotic heart disease of native coronary artery without angina pectoris: Secondary | ICD-10-CM | POA: Diagnosis not present

## 2023-11-17 DIAGNOSIS — I4892 Unspecified atrial flutter: Secondary | ICD-10-CM | POA: Diagnosis present

## 2023-11-17 DIAGNOSIS — R918 Other nonspecific abnormal finding of lung field: Secondary | ICD-10-CM | POA: Diagnosis not present

## 2023-11-17 DIAGNOSIS — R231 Pallor: Secondary | ICD-10-CM | POA: Diagnosis not present

## 2023-11-17 DIAGNOSIS — R0902 Hypoxemia: Secondary | ICD-10-CM | POA: Diagnosis not present

## 2023-11-17 DIAGNOSIS — I10 Essential (primary) hypertension: Secondary | ICD-10-CM | POA: Diagnosis not present

## 2023-11-17 DIAGNOSIS — D62 Acute posthemorrhagic anemia: Secondary | ICD-10-CM

## 2023-11-17 DIAGNOSIS — K5900 Constipation, unspecified: Secondary | ICD-10-CM | POA: Diagnosis not present

## 2023-11-17 DIAGNOSIS — I4891 Unspecified atrial fibrillation: Secondary | ICD-10-CM | POA: Diagnosis present

## 2023-11-17 DIAGNOSIS — I3139 Other pericardial effusion (noninflammatory): Secondary | ICD-10-CM | POA: Diagnosis present

## 2023-11-17 DIAGNOSIS — I7410 Embolism and thrombosis of unspecified parts of aorta: Secondary | ICD-10-CM | POA: Diagnosis not present

## 2023-11-17 DIAGNOSIS — R079 Chest pain, unspecified: Secondary | ICD-10-CM | POA: Diagnosis not present

## 2023-11-17 DIAGNOSIS — R2689 Other abnormalities of gait and mobility: Secondary | ICD-10-CM | POA: Diagnosis not present

## 2023-11-17 DIAGNOSIS — J9601 Acute respiratory failure with hypoxia: Secondary | ICD-10-CM | POA: Diagnosis not present

## 2023-11-17 DIAGNOSIS — Z8249 Family history of ischemic heart disease and other diseases of the circulatory system: Secondary | ICD-10-CM

## 2023-11-17 DIAGNOSIS — Z743 Need for continuous supervision: Secondary | ICD-10-CM | POA: Diagnosis not present

## 2023-11-17 DIAGNOSIS — D696 Thrombocytopenia, unspecified: Secondary | ICD-10-CM | POA: Diagnosis not present

## 2023-11-17 DIAGNOSIS — Z48812 Encounter for surgical aftercare following surgery on the circulatory system: Secondary | ICD-10-CM | POA: Diagnosis not present

## 2023-11-17 DIAGNOSIS — R0789 Other chest pain: Secondary | ICD-10-CM | POA: Diagnosis not present

## 2023-11-17 DIAGNOSIS — I351 Nonrheumatic aortic (valve) insufficiency: Secondary | ICD-10-CM | POA: Diagnosis not present

## 2023-11-17 DIAGNOSIS — Z79899 Other long term (current) drug therapy: Secondary | ICD-10-CM | POA: Diagnosis not present

## 2023-11-17 DIAGNOSIS — Z7982 Long term (current) use of aspirin: Secondary | ICD-10-CM

## 2023-11-17 DIAGNOSIS — Z4682 Encounter for fitting and adjustment of non-vascular catheter: Secondary | ICD-10-CM | POA: Diagnosis not present

## 2023-11-17 DIAGNOSIS — J9811 Atelectasis: Secondary | ICD-10-CM | POA: Diagnosis not present

## 2023-11-17 DIAGNOSIS — R001 Bradycardia, unspecified: Secondary | ICD-10-CM | POA: Diagnosis not present

## 2023-11-17 DIAGNOSIS — Z5320 Procedure and treatment not carried out because of patient's decision for unspecified reasons: Secondary | ICD-10-CM | POA: Diagnosis not present

## 2023-11-17 HISTORY — PX: REPAIR OF ACUTE ASCENDING THORACIC AORTIC DISSECTION: SHX6323

## 2023-11-17 HISTORY — PX: INTRAOPERATIVE TRANSESOPHAGEAL ECHOCARDIOGRAM: SHX5062

## 2023-11-17 LAB — POCT I-STAT 7, (LYTES, BLD GAS, ICA,H+H)
Acid-base deficit: 5 mmol/L — ABNORMAL HIGH (ref 0.0–2.0)
Acid-base deficit: 2 mmol/L (ref 0.0–2.0)
Acid-base deficit: 3 mmol/L — ABNORMAL HIGH (ref 0.0–2.0)
Acid-base deficit: 4 mmol/L — ABNORMAL HIGH (ref 0.0–2.0)
Acid-base deficit: 2 mmol/L (ref 0.0–2.0)
Bicarbonate: 20.7 mmol/L (ref 20.0–28.0)
Bicarbonate: 26.1 mmol/L (ref 20.0–28.0)
Bicarbonate: 21.2 mmol/L (ref 20.0–28.0)
Bicarbonate: 21.4 mmol/L (ref 20.0–28.0)
Bicarbonate: 23.5 mmol/L (ref 20.0–28.0)
Calcium, Ion: 1.01 mmol/L — ABNORMAL LOW (ref 1.15–1.40)
Calcium, Ion: 1.05 mmol/L — ABNORMAL LOW (ref 1.15–1.40)
Calcium, Ion: 1.02 mmol/L — ABNORMAL LOW (ref 1.15–1.40)
Calcium, Ion: 1.03 mmol/L — ABNORMAL LOW (ref 1.15–1.40)
Calcium, Ion: 1.03 mmol/L — ABNORMAL LOW (ref 1.15–1.40)
HCT: 31 % — ABNORMAL LOW (ref 39.0–52.0)
HCT: 29 % — ABNORMAL LOW (ref 39.0–52.0)
HCT: 29 % — ABNORMAL LOW (ref 39.0–52.0)
HCT: 30 % — ABNORMAL LOW (ref 39.0–52.0)
HCT: 28 % — ABNORMAL LOW (ref 39.0–52.0)
Hemoglobin: 10.5 g/dL — ABNORMAL LOW (ref 13.0–17.0)
Hemoglobin: 9.9 g/dL — ABNORMAL LOW (ref 13.0–17.0)
Hemoglobin: 10.2 g/dL — ABNORMAL LOW (ref 13.0–17.0)
Hemoglobin: 9.9 g/dL — ABNORMAL LOW (ref 13.0–17.0)
Hemoglobin: 9.5 g/dL — ABNORMAL LOW (ref 13.0–17.0)
O2 Saturation: 100 %
O2 Saturation: 100 %
O2 Saturation: 100 %
O2 Saturation: 100 %
O2 Saturation: 98 %
Patient temperature: 97.6
Potassium: 4.5 mmol/L (ref 3.5–5.1)
Potassium: 4.4 mmol/L (ref 3.5–5.1)
Potassium: 4.1 mmol/L (ref 3.5–5.1)
Potassium: 4.7 mmol/L (ref 3.5–5.1)
Potassium: 3.7 mmol/L (ref 3.5–5.1)
Sodium: 135 mmol/L (ref 135–145)
Sodium: 137 mmol/L (ref 135–145)
Sodium: 136 mmol/L (ref 135–145)
Sodium: 137 mmol/L (ref 135–145)
Sodium: 140 mmol/L (ref 135–145)
TCO2: 22 mmol/L (ref 22–32)
TCO2: 28 mmol/L (ref 22–32)
TCO2: 22 mmol/L (ref 22–32)
TCO2: 23 mmol/L (ref 22–32)
TCO2: 25 mmol/L (ref 22–32)
pCO2 arterial: 38 mmHg (ref 32–48)
pCO2 arterial: 65.2 mmHg (ref 32–48)
pCO2 arterial: 34.7 mmHg (ref 32–48)
pCO2 arterial: 39.5 mmHg (ref 32–48)
pCO2 arterial: 40.3 mmHg (ref 32–48)
pH, Arterial: 7.344 — ABNORMAL LOW (ref 7.35–7.45)
pH, Arterial: 7.21 — ABNORMAL LOW (ref 7.35–7.45)
pH, Arterial: 7.342 — ABNORMAL LOW (ref 7.35–7.45)
pH, Arterial: 7.394 (ref 7.35–7.45)
pH, Arterial: 7.37 (ref 7.35–7.45)
pO2, Arterial: 360 mmHg — ABNORMAL HIGH (ref 83–108)
pO2, Arterial: 461 mmHg — ABNORMAL HIGH (ref 83–108)
pO2, Arterial: 299 mmHg — ABNORMAL HIGH (ref 83–108)
pO2, Arterial: 372 mmHg — ABNORMAL HIGH (ref 83–108)
pO2, Arterial: 100 mmHg (ref 83–108)

## 2023-11-17 LAB — CBC
HCT: 47.5 % (ref 39.0–52.0)
HCT: 29.9 % — ABNORMAL LOW (ref 39.0–52.0)
Hemoglobin: 16 g/dL (ref 13.0–17.0)
Hemoglobin: 10.3 g/dL — ABNORMAL LOW (ref 13.0–17.0)
MCH: 30.2 pg (ref 26.0–34.0)
MCH: 30.7 pg (ref 26.0–34.0)
MCHC: 33.7 g/dL (ref 30.0–36.0)
MCHC: 34.4 g/dL (ref 30.0–36.0)
MCV: 89.6 fL (ref 80.0–100.0)
MCV: 89 fL (ref 80.0–100.0)
Platelets: 152 10*3/uL (ref 150–400)
Platelets: 103 10*3/uL — ABNORMAL LOW (ref 150–400)
RBC: 5.3 MIL/uL (ref 4.22–5.81)
RBC: 3.36 MIL/uL — ABNORMAL LOW (ref 4.22–5.81)
RDW: 12.7 % (ref 11.5–15.5)
RDW: 12.5 % (ref 11.5–15.5)
WBC: 8.4 10*3/uL (ref 4.0–10.5)
WBC: 12.3 10*3/uL — ABNORMAL HIGH (ref 4.0–10.5)
nRBC: 0 % (ref 0.0–0.2)
nRBC: 0 % (ref 0.0–0.2)

## 2023-11-17 LAB — POCT I-STAT, CHEM 8
BUN: 20 mg/dL (ref 8–23)
BUN: 21 mg/dL (ref 8–23)
BUN: 22 mg/dL (ref 8–23)
BUN: 25 mg/dL — ABNORMAL HIGH (ref 8–23)
Calcium, Ion: 1.03 mmol/L — ABNORMAL LOW (ref 1.15–1.40)
Calcium, Ion: 1.07 mmol/L — ABNORMAL LOW (ref 1.15–1.40)
Calcium, Ion: 0.99 mmol/L — ABNORMAL LOW (ref 1.15–1.40)
Calcium, Ion: 1.02 mmol/L — ABNORMAL LOW (ref 1.15–1.40)
Chloride: 101 mmol/L (ref 98–111)
Chloride: 103 mmol/L (ref 98–111)
Chloride: 101 mmol/L (ref 98–111)
Chloride: 102 mmol/L (ref 98–111)
Creatinine, Ser: 0.8 mg/dL (ref 0.61–1.24)
Creatinine, Ser: 0.9 mg/dL (ref 0.61–1.24)
Creatinine, Ser: 0.9 mg/dL (ref 0.61–1.24)
Creatinine, Ser: 0.8 mg/dL (ref 0.61–1.24)
Glucose, Bld: 133 mg/dL — ABNORMAL HIGH (ref 70–99)
Glucose, Bld: 151 mg/dL — ABNORMAL HIGH (ref 70–99)
Glucose, Bld: 208 mg/dL — ABNORMAL HIGH (ref 70–99)
Glucose, Bld: 197 mg/dL — ABNORMAL HIGH (ref 70–99)
HCT: 29 % — ABNORMAL LOW (ref 39.0–52.0)
HCT: 33 % — ABNORMAL LOW (ref 39.0–52.0)
HCT: 28 % — ABNORMAL LOW (ref 39.0–52.0)
HCT: 28 % — ABNORMAL LOW (ref 39.0–52.0)
Hemoglobin: 9.9 g/dL — ABNORMAL LOW (ref 13.0–17.0)
Hemoglobin: 11.2 g/dL — ABNORMAL LOW (ref 13.0–17.0)
Hemoglobin: 9.5 g/dL — ABNORMAL LOW (ref 13.0–17.0)
Hemoglobin: 9.5 g/dL — ABNORMAL LOW (ref 13.0–17.0)
Potassium: 4.5 mmol/L (ref 3.5–5.1)
Potassium: 4.4 mmol/L (ref 3.5–5.1)
Potassium: 4.2 mmol/L (ref 3.5–5.1)
Potassium: 4.2 mmol/L (ref 3.5–5.1)
Sodium: 135 mmol/L (ref 135–145)
Sodium: 135 mmol/L (ref 135–145)
Sodium: 137 mmol/L (ref 135–145)
Sodium: 137 mmol/L (ref 135–145)
TCO2: 22 mmol/L (ref 22–32)
TCO2: 27 mmol/L (ref 22–32)
TCO2: 23 mmol/L (ref 22–32)
TCO2: 24 mmol/L (ref 22–32)

## 2023-11-17 LAB — ABO/RH: ABO/RH(D): B NEG

## 2023-11-17 LAB — PREPARE RBC (CROSSMATCH)

## 2023-11-17 LAB — PROTIME-INR
INR: 1.6 — ABNORMAL HIGH (ref 0.8–1.2)
Prothrombin Time: 19 s — ABNORMAL HIGH (ref 11.4–15.2)

## 2023-11-17 LAB — BASIC METABOLIC PANEL
Anion gap: 13 (ref 5–15)
BUN: 24 mg/dL — ABNORMAL HIGH (ref 8–23)
CO2: 20 mmol/L — ABNORMAL LOW (ref 22–32)
Calcium: 8.1 mg/dL — ABNORMAL LOW (ref 8.9–10.3)
Chloride: 104 mmol/L (ref 98–111)
Creatinine, Ser: 0.95 mg/dL (ref 0.61–1.24)
GFR, Estimated: >60 mL/min (ref >60)
Glucose, Bld: 87 mg/dL (ref 70–99)
Potassium: 5.1 mmol/L (ref 3.5–5.1)
Sodium: 137 mmol/L (ref 135–145)

## 2023-11-17 LAB — GLUCOSE, CAPILLARY
Glucose-Capillary: 119 mg/dL — ABNORMAL HIGH (ref 70–99)
Glucose-Capillary: 157 mg/dL — ABNORMAL HIGH (ref 70–99)
Glucose-Capillary: 122 mg/dL — ABNORMAL HIGH (ref 70–99)
Glucose-Capillary: 135 mg/dL — ABNORMAL HIGH (ref 70–99)

## 2023-11-17 LAB — FIBRINOGEN: Fibrinogen: 168 mg/dL — ABNORMAL LOW (ref 210–475)

## 2023-11-17 LAB — PLATELET COUNT: Platelets: 73 10*3/uL — ABNORMAL LOW (ref 150–400)

## 2023-11-17 LAB — APTT: aPTT: 37 s — ABNORMAL HIGH (ref 24–36)

## 2023-11-17 LAB — TROPONIN I (HIGH SENSITIVITY)
Troponin I (High Sensitivity): 5 ng/L (ref <18)
Troponin I (High Sensitivity): 12 ng/L (ref <18)

## 2023-11-17 LAB — HEMOGLOBIN AND HEMATOCRIT, BLOOD
HCT: 29.3 % — ABNORMAL LOW (ref 39.0–52.0)
Hemoglobin: 9.9 g/dL — ABNORMAL LOW (ref 13.0–17.0)

## 2023-11-17 SURGERY — REPAIR, AORTIC DISSECTION, ASCENDING
Anesthesia: General | Site: Esophagus

## 2023-11-17 MED ORDER — METOPROLOL TARTRATE 25 MG/10 ML ORAL SUSPENSION
12.5000 mg | Freq: Two times a day (BID) | ORAL | Status: DC
  Administered 2023-11-17: 12.5 mg
  Filled 2023-11-17: qty 10

## 2023-11-17 MED ORDER — NOREPINEPHRINE 4 MG/250ML-% IV SOLN
0.0000 ug/min | INTRAVENOUS | Status: DC
  Filled 2023-11-17: qty 250

## 2023-11-17 MED ORDER — METOPROLOL TARTRATE 5 MG/5ML IV SOLN
2.5000 mg | INTRAVENOUS | Status: DC | PRN
  Administered 2023-11-20: 5 mg via INTRAVENOUS
  Filled 2023-11-17: qty 5

## 2023-11-17 MED ORDER — PHENYLEPHRINE HCL (PRESSORS) 10 MG/ML IV SOLN
INTRAVENOUS | Status: AC
  Filled 2023-11-17: qty 1

## 2023-11-17 MED ORDER — VANCOMYCIN HCL IN DEXTROSE 1-5 GM/200ML-% IV SOLN
1000.0000 mg | Freq: Once | INTRAVENOUS | Status: AC
  Administered 2023-11-18: 1000 mg via INTRAVENOUS
  Filled 2023-11-17: qty 200

## 2023-11-17 MED ORDER — SODIUM CHLORIDE 0.9% FLUSH: 3.0000 mL | INTRAVENOUS | Status: DC | PRN

## 2023-11-17 MED ORDER — FENTANYL CITRATE (PF) 250 MCG/5ML IJ SOLN
INTRAMUSCULAR | Status: AC
Start: 2023-11-17 — End: ?
  Filled 2023-11-17: qty 5

## 2023-11-17 MED ORDER — ACETAMINOPHEN 160 MG/5ML PO SOLN
650.0000 mg | Freq: Once | ORAL | Status: AC
  Administered 2023-11-17: 650 mg
  Filled 2023-11-17: qty 20.3

## 2023-11-17 MED ORDER — MIDAZOLAM HCL (PF) 5 MG/ML IJ SOLN
INTRAMUSCULAR | Status: DC | PRN
  Administered 2023-11-17: 3 mg via INTRAVENOUS
  Administered 2023-11-17: 2 mg via INTRAVENOUS
  Administered 2023-11-17: 1 mg via INTRAVENOUS
  Administered 2023-11-17 (×2): 2 mg via INTRAVENOUS

## 2023-11-17 MED ORDER — MORPHINE SULFATE (PF) 2 MG/ML IV SOLN
1.0000 mg | INTRAVENOUS | Status: DC | PRN
  Administered 2023-11-18: 2 mg via INTRAVENOUS
  Filled 2023-11-17 (×2): qty 1

## 2023-11-17 MED ORDER — ROCURONIUM BROMIDE 10 MG/ML (PF) SYRINGE
PREFILLED_SYRINGE | INTRAVENOUS | Status: DC | PRN
  Administered 2023-11-17: 45 mg via INTRAVENOUS
  Administered 2023-11-17 (×3): 50 mg via INTRAVENOUS
  Administered 2023-11-17: 55 mg via INTRAVENOUS

## 2023-11-17 MED ORDER — TEMAZEPAM 15 MG PO CAPS: 15.0000 mg | ORAL_CAPSULE | Freq: Once | ORAL | Status: DC | PRN

## 2023-11-17 MED ORDER — ASPIRIN 325 MG PO TBEC
325.0000 mg | DELAYED_RELEASE_TABLET | Freq: Every day | ORAL | Status: DC
  Administered 2023-11-18 – 2023-11-22 (×3): 325 mg via ORAL
  Filled 2023-11-17 (×4): qty 1

## 2023-11-17 MED ORDER — CHLORHEXIDINE GLUCONATE 0.12 % MT SOLN: 15.0000 mL | Freq: Once | OROMUCOSAL | Status: DC

## 2023-11-17 MED ORDER — TRANEXAMIC ACID (OHS) PUMP PRIME SOLUTION
2.0000 mg/kg | INTRAVENOUS | Status: DC
  Filled 2023-11-17: qty 1.63

## 2023-11-17 MED ORDER — DOCUSATE SODIUM 100 MG PO CAPS
200.0000 mg | ORAL_CAPSULE | Freq: Every day | ORAL | Status: DC
  Administered 2023-11-18 – 2023-11-24 (×7): 200 mg via ORAL
  Filled 2023-11-17 (×7): qty 2

## 2023-11-17 MED ORDER — NICARDIPINE HCL IN NACL 20-0.86 MG/200ML-% IV SOLN
3.0000 mg/h | INTRAVENOUS | Status: DC
  Administered 2023-11-17: 15 mg/h via INTRAVENOUS
  Administered 2023-11-17: 5 mg/h via INTRAVENOUS
  Filled 2023-11-17 (×2): qty 200

## 2023-11-17 MED ORDER — EPINEPHRINE HCL 5 MG/250ML IV SOLN IN NS
0.0000 ug/min | INTRAVENOUS | Status: DC
  Filled 2023-11-17: qty 250

## 2023-11-17 MED ORDER — MAGNESIUM SULFATE 4 GM/100ML IV SOLN
4.0000 g | Freq: Once | INTRAVENOUS | Status: AC
  Administered 2023-11-17: 4 g via INTRAVENOUS
  Filled 2023-11-17: qty 100

## 2023-11-17 MED ORDER — CHLORHEXIDINE GLUCONATE CLOTH 2 % EX PADS: 6.0000 | MEDICATED_PAD | Freq: Once | CUTANEOUS | Status: DC

## 2023-11-17 MED ORDER — VANCOMYCIN HCL 1000 MG IV SOLR
INTRAVENOUS | Status: DC | PRN
  Administered 2023-11-17: 1000 mg

## 2023-11-17 MED ORDER — PHENYLEPHRINE HCL-NACL 20-0.9 MG/250ML-% IV SOLN
30.0000 ug/min | INTRAVENOUS | Status: AC
  Administered 2023-11-17: 15 ug/min via INTRAVENOUS
  Filled 2023-11-17: qty 250

## 2023-11-17 MED ORDER — SODIUM CHLORIDE 0.9% FLUSH
3.0000 mL | Freq: Two times a day (BID) | INTRAVENOUS | Status: DC
  Administered 2023-11-17: 10 mL via INTRAVENOUS
  Administered 2023-11-18 (×2): 3 mL via INTRAVENOUS

## 2023-11-17 MED ORDER — LACTATED RINGERS IV SOLN: INTRAVENOUS | Status: DC | PRN

## 2023-11-17 MED ORDER — VANCOMYCIN HCL 1000 MG IV SOLR
INTRAVENOUS | Status: DC
  Filled 2023-11-17 (×2): qty 20

## 2023-11-17 MED ORDER — BISACODYL 10 MG RE SUPP: 10.0000 mg | Freq: Every day | RECTAL | Status: DC

## 2023-11-17 MED ORDER — DEXMEDETOMIDINE HCL IN NACL 400 MCG/100ML IV SOLN
0.1000 ug/kg/h | INTRAVENOUS | Status: AC
  Administered 2023-11-17: .7 ug/kg/h via INTRAVENOUS
  Filled 2023-11-17: qty 100

## 2023-11-17 MED ORDER — METOPROLOL TARTRATE 12.5 MG HALF TABLET
12.5000 mg | ORAL_TABLET | Freq: Two times a day (BID) | ORAL | Status: DC
  Administered 2023-11-18 (×2): 12.5 mg via ORAL
  Filled 2023-11-17 (×2): qty 1

## 2023-11-17 MED ORDER — ASPIRIN 81 MG PO CHEW
324.0000 mg | CHEWABLE_TABLET | Freq: Once | ORAL | Status: AC
  Administered 2023-11-17: 324 mg via ORAL
  Filled 2023-11-17: qty 4

## 2023-11-17 MED ORDER — PLASMA-LYTE A IV SOLN
INTRAVENOUS | Status: DC | PRN
  Administered 2023-11-17: 500 mL

## 2023-11-17 MED ORDER — PHENYLEPHRINE HCL-NACL 20-0.9 MG/250ML-% IV SOLN
0.0000 ug/min | INTRAVENOUS | Status: DC
Start: 2023-11-17 — End: 2023-11-19
  Administered 2023-11-18: 20 ug/min via INTRAVENOUS

## 2023-11-17 MED ORDER — HEMOSTATIC AGENTS (NO CHARGE) OPTIME
TOPICAL | Status: DC | PRN
  Administered 2023-11-17: 1 via TOPICAL

## 2023-11-17 MED ORDER — SODIUM CHLORIDE 0.9% FLUSH
3.0000 mL | Freq: Two times a day (BID) | INTRAVENOUS | Status: DC
  Administered 2023-11-17: 10 mL via INTRAVENOUS
  Administered 2023-11-18: 3 mL via INTRAVENOUS

## 2023-11-17 MED ORDER — POTASSIUM CHLORIDE 10 MEQ/50ML IV SOLN
10.0000 meq | INTRAVENOUS | Status: AC
  Administered 2023-11-17 (×3): 10 meq via INTRAVENOUS

## 2023-11-17 MED ORDER — SODIUM CHLORIDE 0.9% FLUSH
3.0000 mL | Freq: Two times a day (BID) | INTRAVENOUS | Status: DC
  Administered 2023-11-17 – 2023-11-18 (×2): 10 mL via INTRAVENOUS
  Administered 2023-11-18: 3 mL via INTRAVENOUS
  Administered 2023-11-19 – 2023-11-20 (×4): 10 mL via INTRAVENOUS

## 2023-11-17 MED ORDER — MIDAZOLAM HCL 2 MG/2ML IJ SOLN
2.0000 mg | INTRAMUSCULAR | Status: DC | PRN
  Administered 2023-11-17 – 2023-11-18 (×2): 2 mg via INTRAVENOUS
  Filled 2023-11-17 (×2): qty 2

## 2023-11-17 MED ORDER — SODIUM CHLORIDE 0.9 % IV SOLN: INTRAVENOUS | Status: DC | PRN

## 2023-11-17 MED ORDER — POTASSIUM CHLORIDE 2 MEQ/ML IV SOLN
80.0000 meq | INTRAVENOUS | Status: DC
  Filled 2023-11-17: qty 40

## 2023-11-17 MED ORDER — AMIODARONE HCL 200 MG PO TABS: 400.0000 mg | ORAL_TABLET | Freq: Two times a day (BID) | ORAL | Status: DC

## 2023-11-17 MED ORDER — PANTOPRAZOLE SODIUM 40 MG IV SOLR
40.0000 mg | Freq: Every day | INTRAVENOUS | Status: AC
  Administered 2023-11-17 – 2023-11-18 (×2): 40 mg via INTRAVENOUS
  Filled 2023-11-17 (×2): qty 10

## 2023-11-17 MED ORDER — ROCURONIUM BROMIDE 10 MG/ML (PF) SYRINGE
PREFILLED_SYRINGE | INTRAVENOUS | Status: AC
  Filled 2023-11-17: qty 10

## 2023-11-17 MED ORDER — TRANEXAMIC ACID (OHS) BOLUS VIA INFUSION
15.0000 mg/kg | INTRAVENOUS | Status: AC
  Administered 2023-11-17: 1224 mg via INTRAVENOUS
  Filled 2023-11-17: qty 1224

## 2023-11-17 MED ORDER — PLASMA-LYTE A IV SOLN
INTRAVENOUS | Status: DC
  Filled 2023-11-17: qty 2.5

## 2023-11-17 MED ORDER — PROPOFOL 10 MG/ML IV BOLUS
INTRAVENOUS | Status: AC
  Filled 2023-11-17: qty 20

## 2023-11-17 MED ORDER — SODIUM CHLORIDE 0.9% IV SOLUTION: Freq: Once | INTRAVENOUS | Status: DC

## 2023-11-17 MED ORDER — VASOPRESSIN 20 UNIT/ML IV SOLN
INTRAVENOUS | Status: DC | PRN
  Administered 2023-11-17 (×3): 1 [IU] via INTRAVENOUS

## 2023-11-17 MED ORDER — BISACODYL 5 MG PO TBEC
10.0000 mg | DELAYED_RELEASE_TABLET | Freq: Every day | ORAL | Status: DC
  Administered 2023-11-18 – 2023-11-24 (×7): 10 mg via ORAL
  Filled 2023-11-17 (×7): qty 2

## 2023-11-17 MED ORDER — ALBUMIN HUMAN 5 % IV SOLN: INTRAVENOUS | Status: DC | PRN

## 2023-11-17 MED ORDER — HEPARIN 30,000 UNITS/1000 ML (OHS) CELLSAVER SOLUTION
Status: DC
  Filled 2023-11-17: qty 1000

## 2023-11-17 MED ORDER — SODIUM CHLORIDE 0.9 % IV SOLN: INTRAVENOUS | Status: AC

## 2023-11-17 MED ORDER — CHLORHEXIDINE GLUCONATE CLOTH 2 % EX PADS
6.0000 | MEDICATED_PAD | Freq: Every day | CUTANEOUS | Status: DC
  Administered 2023-11-17 – 2023-11-24 (×7): 6 via TOPICAL

## 2023-11-17 MED ORDER — ORAL CARE MOUTH RINSE
15.0000 mL | OROMUCOSAL | Status: DC
  Administered 2023-11-17 – 2023-11-18 (×7): 15 mL via OROMUCOSAL

## 2023-11-17 MED ORDER — METOCLOPRAMIDE HCL 5 MG/ML IJ SOLN
10.0000 mg | Freq: Four times a day (QID) | INTRAMUSCULAR | Status: AC
  Administered 2023-11-17 – 2023-11-19 (×6): 10 mg via INTRAVENOUS
  Filled 2023-11-17 (×6): qty 2

## 2023-11-17 MED ORDER — CEFAZOLIN SODIUM-DEXTROSE 2-4 GM/100ML-% IV SOLN
2.0000 g | Freq: Three times a day (TID) | INTRAVENOUS | Status: AC
  Administered 2023-11-17 – 2023-11-19 (×6): 2 g via INTRAVENOUS
  Filled 2023-11-17 (×6): qty 100

## 2023-11-17 MED ORDER — METHYLPREDNISOLONE SODIUM SUCC 125 MG IJ SOLR
INTRAMUSCULAR | Status: DC | PRN
  Administered 2023-11-17: 125 mg via INTRAVENOUS

## 2023-11-17 MED ORDER — FENTANYL CITRATE (PF) 250 MCG/5ML IJ SOLN
INTRAMUSCULAR | Status: DC | PRN
  Administered 2023-11-17: 50 ug via INTRAVENOUS
  Administered 2023-11-17: 100 ug via INTRAVENOUS
  Administered 2023-11-17 (×2): 200 ug via INTRAVENOUS
  Administered 2023-11-17: 50 ug via INTRAVENOUS
  Administered 2023-11-17: 150 ug via INTRAVENOUS

## 2023-11-17 MED ORDER — HEMOSTATIC AGENTS (NO CHARGE) OPTIME
TOPICAL | Status: DC | PRN
  Administered 2023-11-17 (×2): 1 via TOPICAL

## 2023-11-17 MED ORDER — NITROGLYCERIN IN D5W 200-5 MCG/ML-% IV SOLN
0.0000 ug/min | INTRAVENOUS | Status: DC
  Administered 2023-11-17: 5 ug/min via INTRAVENOUS
  Administered 2023-11-18: 10 ug/min via INTRAVENOUS

## 2023-11-17 MED ORDER — SUCCINYLCHOLINE CHLORIDE 200 MG/10ML IV SOSY
PREFILLED_SYRINGE | INTRAVENOUS | Status: DC | PRN
  Administered 2023-11-17: 160 mg via INTRAVENOUS

## 2023-11-17 MED ORDER — NITROPRUSSIDE SODIUM-NACL 20-0.9 MG/100ML-% IV SOLN
0.0000 ug/kg/min | INTRAVENOUS | Status: DC
  Administered 2023-11-17: 0.25 ug/kg/min via INTRAVENOUS
  Filled 2023-11-17: qty 100

## 2023-11-17 MED ORDER — MIDAZOLAM HCL (PF) 10 MG/2ML IJ SOLN
INTRAMUSCULAR | Status: AC
  Filled 2023-11-17: qty 2

## 2023-11-17 MED ORDER — LACTATED RINGERS IV SOLN: INTRAVENOUS | Status: AC

## 2023-11-17 MED ORDER — CHLORHEXIDINE GLUCONATE 0.12 % MT SOLN
15.0000 mL | OROMUCOSAL | Status: AC
  Administered 2023-11-17: 15 mL via OROMUCOSAL
  Filled 2023-11-17: qty 15

## 2023-11-17 MED ORDER — VASOPRESSIN 20 UNIT/ML IV SOLN
INTRAVENOUS | Status: AC
  Filled 2023-11-17: qty 1

## 2023-11-17 MED ORDER — NITROGLYCERIN IN D5W 200-5 MCG/ML-% IV SOLN
2.0000 ug/min | INTRAVENOUS | Status: DC
  Filled 2023-11-17: qty 250

## 2023-11-17 MED ORDER — TRANEXAMIC ACID 1000 MG/10ML IV SOLN
1.5000 mg/kg/h | INTRAVENOUS | Status: AC
  Administered 2023-11-17: 1.5 mg/kg/h via INTRAVENOUS
  Filled 2023-11-17 (×2): qty 25

## 2023-11-17 MED ORDER — MORPHINE SULFATE (PF) 4 MG/ML IV SOLN
4.0000 mg | Freq: Once | INTRAVENOUS | Status: AC
  Administered 2023-11-17: 4 mg via INTRAVENOUS
  Filled 2023-11-17: qty 1

## 2023-11-17 MED ORDER — OXYCODONE HCL 5 MG PO TABS
5.0000 mg | ORAL_TABLET | ORAL | Status: DC | PRN
  Administered 2023-11-18: 10 mg via ORAL
  Administered 2023-11-18 – 2023-11-20 (×2): 5 mg via ORAL
  Administered 2023-11-20: 10 mg via ORAL
  Administered 2023-11-20 – 2023-11-21 (×3): 5 mg via ORAL
  Filled 2023-11-17 (×2): qty 2
  Filled 2023-11-17 (×2): qty 1
  Filled 2023-11-17: qty 2
  Filled 2023-11-17 (×2): qty 1

## 2023-11-17 MED ORDER — CALCIUM GLUCONATE-NACL 2-0.675 GM/100ML-% IV SOLN
2.0000 g | Freq: Once | INTRAVENOUS | Status: AC
  Administered 2023-11-17: 2000 mg via INTRAVENOUS
  Filled 2023-11-17: qty 100

## 2023-11-17 MED ORDER — PHENYLEPHRINE 80 MCG/ML (10ML) SYRINGE FOR IV PUSH (FOR BLOOD PRESSURE SUPPORT)
PREFILLED_SYRINGE | INTRAVENOUS | Status: DC | PRN
  Administered 2023-11-17: 80 ug via INTRAVENOUS
  Administered 2023-11-17: 160 ug via INTRAVENOUS
  Administered 2023-11-17 (×3): 80 ug via INTRAVENOUS

## 2023-11-17 MED ORDER — CEFAZOLIN SODIUM-DEXTROSE 2-4 GM/100ML-% IV SOLN
2.0000 g | INTRAVENOUS | Status: AC
  Administered 2023-11-17: 2 g via INTRAVENOUS
  Filled 2023-11-17: qty 100

## 2023-11-17 MED ORDER — SODIUM CHLORIDE 0.9 % IV SOLN: 10.0000 mL/h | Freq: Once | INTRAVENOUS | Status: DC

## 2023-11-17 MED ORDER — MANNITOL 20 % IV SOLN
INTRAVENOUS | Status: DC
  Filled 2023-11-17: qty 13

## 2023-11-17 MED ORDER — HEPARIN SODIUM (PORCINE) 1000 UNIT/ML IJ SOLN
INTRAMUSCULAR | Status: DC | PRN
  Administered 2023-11-17: 29000 [IU] via INTRAVENOUS

## 2023-11-17 MED ORDER — VANCOMYCIN HCL 1000 MG IV SOLR
INTRAVENOUS | Status: AC
  Filled 2023-11-17: qty 20

## 2023-11-17 MED ORDER — ESMOLOL HCL-SODIUM CHLORIDE 2000 MG/100ML IV SOLN
25.0000 ug/kg/min | INTRAVENOUS | Status: DC
  Administered 2023-11-17: 50 ug/kg/min via INTRAVENOUS
  Filled 2023-11-17: qty 100

## 2023-11-17 MED ORDER — BISACODYL 5 MG PO TBEC
5.0000 mg | DELAYED_RELEASE_TABLET | Freq: Once | ORAL | Status: DC
  Filled 2023-11-17: qty 1

## 2023-11-17 MED ORDER — PANTOPRAZOLE SODIUM 40 MG PO TBEC
40.0000 mg | DELAYED_RELEASE_TABLET | Freq: Every day | ORAL | Status: DC
  Administered 2023-11-19 – 2023-11-24 (×6): 40 mg via ORAL
  Filled 2023-11-17 (×6): qty 1

## 2023-11-17 MED ORDER — TRAMADOL HCL 50 MG PO TABS
50.0000 mg | ORAL_TABLET | ORAL | Status: DC | PRN
  Administered 2023-11-18: 50 mg via ORAL
  Filled 2023-11-17: qty 1

## 2023-11-17 MED ORDER — ACETAMINOPHEN 160 MG/5ML PO SOLN
1000.0000 mg | Freq: Four times a day (QID) | ORAL | Status: AC
  Administered 2023-11-17 – 2023-11-18 (×2): 1000 mg
  Filled 2023-11-17 (×2): qty 40.6

## 2023-11-17 MED ORDER — DEXMEDETOMIDINE HCL IN NACL 400 MCG/100ML IV SOLN
0.0000 ug/kg/h | INTRAVENOUS | Status: DC
  Administered 2023-11-17: 0.7 ug/kg/h via INTRAVENOUS
  Filled 2023-11-17: qty 100

## 2023-11-17 MED ORDER — 0.9 % SODIUM CHLORIDE (POUR BTL) OPTIME
TOPICAL | Status: DC | PRN
  Administered 2023-11-17: 5000 mL

## 2023-11-17 MED ORDER — IOHEXOL 350 MG/ML SOLN
100.0000 mL | Freq: Once | INTRAVENOUS | Status: AC | PRN
  Administered 2023-11-17: 100 mL via INTRAVENOUS

## 2023-11-17 MED ORDER — ASPIRIN 81 MG PO CHEW
324.0000 mg | CHEWABLE_TABLET | Freq: Every day | ORAL | Status: DC
  Administered 2023-11-19 – 2023-11-20 (×2): 324 mg
  Filled 2023-11-17 (×2): qty 4

## 2023-11-17 MED ORDER — MILRINONE LACTATE IN DEXTROSE 20-5 MG/100ML-% IV SOLN
0.3000 ug/kg/min | INTRAVENOUS | Status: DC
  Filled 2023-11-17: qty 100

## 2023-11-17 MED ORDER — FENTANYL CITRATE (PF) 250 MCG/5ML IJ SOLN
INTRAMUSCULAR | Status: AC
  Filled 2023-11-17: qty 5

## 2023-11-17 MED ORDER — ROSUVASTATIN CALCIUM 20 MG PO TABS
40.0000 mg | ORAL_TABLET | Freq: Every day | ORAL | Status: DC
  Administered 2023-11-18 – 2023-11-24 (×7): 40 mg via ORAL
  Filled 2023-11-17 (×7): qty 2

## 2023-11-17 MED ORDER — METOPROLOL TARTRATE 12.5 MG HALF TABLET: 12.5000 mg | ORAL_TABLET | Freq: Once | ORAL | Status: DC

## 2023-11-17 MED ORDER — SODIUM CHLORIDE 0.9% FLUSH
3.0000 mL | Freq: Two times a day (BID) | INTRAVENOUS | Status: DC
  Administered 2023-11-18 – 2023-11-20 (×6): 3 mL via INTRAVENOUS

## 2023-11-17 MED ORDER — VANCOMYCIN HCL 1250 MG/250ML IV SOLN
1250.0000 mg | INTRAVENOUS | Status: AC
  Administered 2023-11-17: 1250 mg via INTRAVENOUS
  Filled 2023-11-17: qty 250

## 2023-11-17 MED ORDER — INSULIN REGULAR(HUMAN) IN NACL 100-0.9 UT/100ML-% IV SOLN: INTRAVENOUS | Status: DC

## 2023-11-17 MED ORDER — PROTAMINE SULFATE 10 MG/ML IV SOLN
INTRAVENOUS | Status: DC | PRN
  Administered 2023-11-17: 290 mg via INTRAVENOUS

## 2023-11-17 MED ORDER — ONDANSETRON HCL 4 MG/2ML IJ SOLN
4.0000 mg | Freq: Four times a day (QID) | INTRAMUSCULAR | Status: DC | PRN
  Filled 2023-11-17: qty 2

## 2023-11-17 MED ORDER — INSULIN REGULAR(HUMAN) IN NACL 100-0.9 UT/100ML-% IV SOLN
INTRAVENOUS | Status: AC
  Administered 2023-11-17: 1.4 [IU]/h via INTRAVENOUS
  Filled 2023-11-17: qty 100

## 2023-11-17 MED ORDER — ALBUMIN HUMAN 5 % IV SOLN
250.0000 mL | INTRAVENOUS | Status: AC | PRN
  Administered 2023-11-17 – 2023-11-18 (×5): 12.5 g via INTRAVENOUS
  Filled 2023-11-17 (×2): qty 250

## 2023-11-17 MED ORDER — ORAL CARE MOUTH RINSE: 15.0000 mL | OROMUCOSAL | Status: DC | PRN

## 2023-11-17 MED ORDER — ACETAMINOPHEN 500 MG PO TABS
1000.0000 mg | ORAL_TABLET | Freq: Four times a day (QID) | ORAL | Status: AC
  Administered 2023-11-18 – 2023-11-21 (×9): 1000 mg via ORAL
  Filled 2023-11-17 (×11): qty 2

## 2023-11-17 MED ORDER — EPHEDRINE SULFATE-NACL 50-0.9 MG/10ML-% IV SOSY
PREFILLED_SYRINGE | INTRAVENOUS | Status: DC | PRN
  Administered 2023-11-17: 2.5 mg via INTRAVENOUS

## 2023-11-17 MED ORDER — PROPOFOL 10 MG/ML IV BOLUS
INTRAVENOUS | Status: DC | PRN
  Administered 2023-11-17 (×4): 50 mg via INTRAVENOUS
  Administered 2023-11-17: 100 mg via INTRAVENOUS
  Administered 2023-11-17 (×2): 50 mg via INTRAVENOUS

## 2023-11-17 MED ORDER — DEXTROSE 50 % IV SOLN: 0.0000 mL | INTRAVENOUS | Status: DC | PRN

## 2023-11-17 SURGICAL SUPPLY — 84 items
ADAPTER CARDIO PERF ANTE/RETRO (ADAPTER) ×2 IMPLANT
BAG DECANTER FOR FLEXI CONT (MISCELLANEOUS) ×2 IMPLANT
BLADE CLIPPER SURG (BLADE) ×2 IMPLANT
BLADE STERNUM SYSTEM 6 (BLADE) ×2 IMPLANT
BNDG ELASTIC 4X5.8 VLCR STR LF (GAUZE/BANDAGES/DRESSINGS) ×2 IMPLANT
BNDG ELASTIC 6INX 5YD STR LF (GAUZE/BANDAGES/DRESSINGS) ×2 IMPLANT
BNDG GAUZE DERMACEA FLUFF 4 (GAUZE/BANDAGES/DRESSINGS) ×2 IMPLANT
CANISTER SUCT 3000ML PPV (MISCELLANEOUS) ×2 IMPLANT
CANNULA MC2 2 STG 36/46 CONN (CANNULA) IMPLANT
CANNULA NON VENT 22FR 12 (CANNULA) ×2 IMPLANT
CATH HEART VENT LEFT (CATHETERS) IMPLANT
CATH RETROPLEGIA CORONARY 14FR (CATHETERS) ×2 IMPLANT
CATH ROBINSON RED A/P 18FR (CATHETERS) ×6 IMPLANT
CATH THOR RT ANG 28F 9128 SOFT (CATHETERS) IMPLANT
CATH THOR STR 32F SOFT 20 RADI (CATHETERS) ×4 IMPLANT
CATH THORACIC 28FR RT ANG (CATHETERS) ×2 IMPLANT
CAUTERY HI TEMP FINE TIP 2 (MISCELLANEOUS) IMPLANT
CLIP TI MEDIUM 24 (CLIP) IMPLANT
CLIP TI WIDE RED SMALL 24 (CLIP) IMPLANT
CNTNR URN SCR LID CUP LEK RST (MISCELLANEOUS) IMPLANT
CONTAINER PROTECT SURGISLUSH (MISCELLANEOUS) ×4 IMPLANT
DERMABOND ADVANCED .7 DNX12 (GAUZE/BANDAGES/DRESSINGS) IMPLANT
DEV SUMP PERICARDIAL 20F 15IN (MISCELLANEOUS) IMPLANT
DRAPE CV SPLIT W-CLR ANES SCRN (DRAPES) ×2 IMPLANT
DRAPE INCISE IOBAN 66X45 STRL (DRAPES) ×2 IMPLANT
DRAPE PERI GROIN 82X75IN TIB (DRAPES) ×2 IMPLANT
DRAPE WARM FLUID 44X44 (DRAPES) ×2 IMPLANT
DRSG AQUACEL AG ADV 3.5X10 (GAUZE/BANDAGES/DRESSINGS) IMPLANT
ELECT CAUTERY BLADE 6.4 (BLADE) ×2 IMPLANT
ELECT REM PT RETURN 9FT ADLT (ELECTROSURGICAL) ×4 IMPLANT
ELECTRODE REM PT RTRN 9FT ADLT (ELECTROSURGICAL) ×4 IMPLANT
FELT TEFLON 1X6 (MISCELLANEOUS) ×4 IMPLANT
GAUZE 4X4 16PLY ~~LOC~~+RFID DBL (SPONGE) IMPLANT
GAUZE SPONGE 4X4 12PLY STRL (GAUZE/BANDAGES/DRESSINGS) ×4 IMPLANT
GOWN STRL REUS W/ TWL LRG LVL3 (GOWN DISPOSABLE) ×12 IMPLANT
GRAFT HEMASHIELD 32X10 (Graft) IMPLANT
HEMOSTAT SURGICEL 2X14 (HEMOSTASIS) IMPLANT
INSERT FOGARTY 61MM (MISCELLANEOUS) IMPLANT
KIT BASIN OR (CUSTOM PROCEDURE TRAY) ×2 IMPLANT
KIT CATH CPB BARTLE (MISCELLANEOUS) ×2 IMPLANT
KIT DILATOR VASC 18G NDL (KITS) IMPLANT
KIT SUCTION CATH 14FR (SUCTIONS) ×2 IMPLANT
KIT TURNOVER KIT B (KITS) ×2 IMPLANT
KIT VASOVIEW HEMOPRO 2 VH 4000 (KITS) ×2 IMPLANT
NS IRRIG 1000ML POUR BTL (IV SOLUTION) ×10 IMPLANT
PACK E OPEN HEART (SUTURE) ×2 IMPLANT
PACK OPEN HEART (CUSTOM PROCEDURE TRAY) ×2 IMPLANT
PAD ARMBOARD POSITIONER FOAM (MISCELLANEOUS) ×4 IMPLANT
PAD ELECT DEFIB RADIOL ZOLL (MISCELLANEOUS) ×2 IMPLANT
PENCIL BUTTON HOLSTER BLD 10FT (ELECTRODE) ×2 IMPLANT
POSITIONER HEAD DONUT 9IN (MISCELLANEOUS) ×2 IMPLANT
PUNCH AORTIC ROTATE 4.0MM (MISCELLANEOUS) ×2 IMPLANT
PUNCH AORTIC ROTATE 4.5MM 8IN (MISCELLANEOUS) ×2 IMPLANT
SEALANT SURG COSEAL 8ML (VASCULAR PRODUCTS) IMPLANT
STOPCOCK 4 WAY LG BORE MALE ST (IV SETS) IMPLANT
SUPPORT HEART JANKE-BARRON (MISCELLANEOUS) ×2 IMPLANT
SUT BONE WAX W31G (SUTURE) ×2 IMPLANT
SUT EB EXC GRN/WHT 2-0 V-5 (SUTURE) IMPLANT
SUT ETHIBOND 2 0 SH 36X2 (SUTURE) IMPLANT
SUT MNCRL AB 4-0 PS2 18 (SUTURE) ×4 IMPLANT
SUT PERMA SILK 0 CT1 (SUTURE) IMPLANT
SUT PROLENE 4 0 SH DA (SUTURE) ×2 IMPLANT
SUT PROLENE 4-0 RB1 .5 CRCL 36 (SUTURE) IMPLANT
SUT PROLENE 5 0 C 1 36 (SUTURE) IMPLANT
SUT PROLENE 7 0 BV1 MDA (SUTURE) ×2 IMPLANT
SUT SILK 1 TIES 10X30 (SUTURE) IMPLANT
SUT SILK 2 0 SH (SUTURE) IMPLANT
SUT STEEL SZ 6 DBL 3X14 BALL (SUTURE) ×6 IMPLANT
SUT VIC AB 0 CTX36XBRD ANTBCTR (SUTURE) ×4 IMPLANT
SUT VIC AB 2-0 CT1 36 (SUTURE) IMPLANT
SUT VIC AB 2-0 CT1 TAPERPNT 27 (SUTURE) ×4 IMPLANT
SYR 10ML KIT SKIN ADHESIVE (MISCELLANEOUS) IMPLANT
SYSTEM SAHARA CHEST DRAIN ATS (WOUND CARE) ×2 IMPLANT
TAPE CLOTH SURG 4X10 WHT LF (GAUZE/BANDAGES/DRESSINGS) IMPLANT
TAPE PAPER 2X10 WHT MICROPORE (GAUZE/BANDAGES/DRESSINGS) IMPLANT
TOWEL GREEN STERILE (TOWEL DISPOSABLE) ×2 IMPLANT
TOWEL GREEN STERILE FF (TOWEL DISPOSABLE) ×2 IMPLANT
TRAY FOLEY SLVR 16FR TEMP STAT (SET/KITS/TRAYS/PACK) ×2 IMPLANT
TUBING ART PRESS 48 MALE/FEM (TUBING) IMPLANT
TUBING LAP HI FLOW INSUFFLATIO (TUBING) ×2 IMPLANT
UNDERPAD 30X36 HEAVY ABSORB (UNDERPADS AND DIAPERS) ×2 IMPLANT
VALVE AORTIC KONECT RESILIA 23 (Valve) IMPLANT
VENT LEFT HEART 12002 (CATHETERS) ×2 IMPLANT
WATER STERILE IRR 1000ML POUR (IV SOLUTION) ×4 IMPLANT

## 2023-11-17 NOTE — Anesthesia Procedure Notes (Signed)
 Central Venous Catheter Insertion Performed by: Val Eagle, MD, anesthesiologist Start/End3/13/2025 12:48 PM, 11/17/2023 12:58 PM Patient location: OR. Emergency situation Preanesthetic checklist: patient identified, IV checked and monitors and equipment checked Hand hygiene performed  and maximum sterile barriers used  PA cath was placed.Swan type:thermodilution Procedure performed without using ultrasound guided technique. Attempts: 1 Patient tolerated the procedure well with no immediate complications.

## 2023-11-17 NOTE — Op Note (Signed)
 CARDIOVASCULAR SURGERY OPERATIVE NOTE  11/17/2023 Kristopher Flores [id number]  Surgeon:  Ashley Akin, MD  First Assistant: Doree Fudge [facility]                               An experienced assistant was required given the complexity of this surgery and the standard of surgical care. The assistant was needed for exposure, dissection, suctioning, retraction of delicate tissues and sutures, instrument exchange and for overall help during this procedure.     Preoperative Diagnosis:  Acute ascending aortic hematoma  Postoperative Diagnosis:  Same   Procedure: Emergency Ascending aortic root and ascending aortic replacement using a 23mm Connect Pericardial valve conduit and 32mm Hemashield graft from the graft of the connect prosthesis to the innominate artery takeoff utilizing deep hypothermic circulatory arrest and femoral cannulation  Anesthesia:  General Endotracheal   Clinical History/Surgical Indication:  Acute ascending aortic hematoma in pt with previous ascending aortic aneurysm. Pt requires emergency aortic replacement. All the risks and goals an recovery discussed an pt understands and wishes to proceed.   Findings: There was a contained hematoma of the entire ascending aorta into the aortic root with moderate aortic insufficiency.  The site of entry was the lateral ascending aorta.  It was notably entry noted.  Secondary to the right coronary artery being dissected it was felt that this would best be served with the Bentall operation.  At the conclusion of the procedure there was good ventricular function and normal valve function.  Circulatory arrest time was 25 minutes.  Preparation:  The patient was seen in the preoperative holding area and the correct patient, correct operation were confirmed with the patient after reviewing the medical record and catheterization. The consent was signed by me. Preoperative antibiotics were given. A pulmonary arterial line and radial  arterial line were placed by the anesthesia team. The patient was taken back to the operating room and positioned supine on the operating room table. After being placed under general endotracheal anesthesia by the anesthesia team a foley catheter was placed. The neck, chest, abdomen, and both legs were prepped with betadine soap and solution and draped in the usual sterile manner. A surgical time-out was taken and the correct patient and operative procedure were confirmed with the nursing and anesthesia staff.  Operation: Patient became very hypotensive upon induction and the chest and abdomen and groins were prepped and draped in usual sterile fashion quickly.  Median sternotomy incision was then created sternal valve sternal saw.  There did not appear to be acute.  Pericardial effusion and the blood pressure improved.  Pericardial well was developed and an incision was then created in the right groin area and carried down to the right femoral artery.  A 5-0 Prolene suture was then placed in the anterior aspect of the vessel and heparinization was confirmed.  Utilizing the Seldinger technique a 21 mm arterial cannula was then advanced under sounder technique and confirmation of the wire in the descending aorta.  Following connection to the cardiopulmonary bypass circuit a two-stage cannula was placed in the right atrial appendage and cardiopulmonary bypass was instituted.  A left ventricular vent was placed in the right superior pulmonary vein and the coronary sinus retrograde cardioplegia cannula was advanced into the coronary sinus.  A cannula was then also advanced into the superior vena cava and the superior vena cava was controlled with a cable tourniquet. Aortic cross-clamp was placed and retrograde  cardioplegia was delivered with [facility] blood cardioplegia for a liter.  An additional dose was delivered at 45 minutes retrograde and a reanimation dose was delivered just prior to cross-clamp removal.  The  aorta was resected below the cross-clamp and the hematoma was circumferential in the wall of the aorta down to the aortic root.  This also dissected out the noncoronary cusp and the entire right coronary cusp.  There was a tear at the base of the adventitia of the right coronary.  Hematoma was removed from the aorta and the aorta was trimmed down to the sinotubular junction.  Secondary to concern over the dissection of his right coronary artery not being able to be managed with just glueing the sinuses were resected and coronary buttons performed.  At this time the aortic leaflets were resected and 2-0 Ethibond sutures were placed circle annularly with the pledgets externally.  A 23 mm connect Resilia pericardial tissue prosthesis was then secured to the proximal suture line and at this time the patient was at 26 degrees rectally. Patient was placed in the headdown position and cardiopulmonary bypass was discontinued and retrograde cerebral perfusion was performed through the superior vena cava cannula which was controlled with a cable tourniquet. The cross-clamp was removed and the aorta resected up into the takeoff of the innominate artery.  The hematoma was again mostly circumferential and the thrombus was removed and stay sutures of pledgeted 4-0 Prolene's were then placed in a triangular fashion and the dissection flap was glued with BioGlue.  Utilizing a 30 mm Hemashield graft this was anastomosed in an end to end anastomosis to the distal aorta with a running 4-0 Prolene sutures.   Was de-aired by resuming cardiopulmonary bypass femoral he and the cross-clamp was placed there was good hemostasis of the suture line.  The superior vena cava cannula was removed and secured.  At this time the coronary buttons were then secured to the connect graft with 5-0 Prolene sutures.  A graft to graft anastomosis was then performed with a running 4-0 Prolene sutures.  An antegrade cardioplegia catheter was placed in the  ascending aorta and with the patient continued in headdown position aortic cross-clamp was removed.  Ventricular and atrial pacing wires were placed and brought out the inferior stab wounds and secured. With adequate rewarming the patient was weaned from cardiopulmonary bypass on no inotropic support.  With adequate hemodynamics protamine was delivered and the patient was decannulated sites oversewn necessary.  The femoral artery cannula was removed and this was secured adequately and this wound was closed multilayer absorbable suture. Chest tubes were brought inferior stab was secured and with adequate hemostasis the sternum was reapproximated with interrupted stainless steel wire and the presternal subcutaneous tissue and skin were closed multilayer's absorbable suture sterile dressings were applied.

## 2023-11-17 NOTE — Anesthesia Procedure Notes (Signed)
 Arterial Line Insertion Start/End3/13/2025 12:59 PM Performed by: Marena Chancy, CRNA, CRNA  Patient location: Pre-op. Preanesthetic checklist: patient identified, IV checked, site marked, risks and benefits discussed, surgical consent, monitors and equipment checked, pre-op evaluation, timeout performed and anesthesia consent Lidocaine 1% used for infiltration Right, radial was placed Catheter size: 20 G Hand hygiene performed  and maximum sterile barriers used   Attempts: 1 Procedure performed without using ultrasound guided technique. Following insertion, dressing applied and Biopatch. Post procedure assessment: normal and unchanged

## 2023-11-17 NOTE — Anesthesia Preprocedure Evaluation (Signed)
 Anesthesia Evaluation  Patient identified by MRN, date of birth, ID band  Reviewed: Unable to perform ROS - Chart review onlyPreop documentation limited or incomplete due to emergent nature of procedure.  Airway Mallampati: III  TM Distance: >3 FB Neck ROM: Full    Dental  (+)    Pulmonary    breath sounds clear to auscultation       Cardiovascular hypertension,  Rhythm:Regular  Aortic intramural thrombus   Neuro/Psych    GI/Hepatic   Endo/Other    Renal/GU      Musculoskeletal   Abdominal   Peds  Hematology   Anesthesia Other Findings  1. Left ventricular ejection fraction, by estimation, is 60 to 65%. Left  ventricular ejection fraction by 3D volume is 62 %. The left ventricle has  normal function. The left ventricle has no regional wall motion  abnormalities. There is mild concentric  left ventricular hypertrophy. Left ventricular diastolic parameters are  consistent with Grade I diastolic dysfunction (impaired relaxation). The  average left ventricular global longitudinal strain is -23.3 %. The global  longitudinal strain is normal.   2. Right ventricular systolic function is normal. The right ventricular  size is normal. There is normal pulmonary artery systolic pressure. The  estimated right ventricular systolic pressure is 28.4 mmHg.   3. The mitral valve is normal in structure. No evidence of mitral valve  regurgitation. No evidence of mitral stenosis.   4. The aortic valve is tricuspid. There is moderate calcification of the  aortic valve. There is mild thickening of the aortic valve. Aortic valve  regurgitation is moderate, but eccentric. Aortic valve sclerosis is  present, with no evidence of aortic  valve stenosis. There is no diastolic flow reversal.   5. Aortic dilatation noted. There is severe dilatation of the ascending  aorta, measuring 54 mm.   6. The inferior vena cava is dilated in size with  >50% respiratory  variability, suggesting right atrial pressure of 8 mmHg.    Reproductive/Obstetrics                              Anesthesia Physical Anesthesia Plan  ASA: 5 and emergent  Anesthesia Plan: General   Post-op Pain Management:    Induction: Intravenous, Rapid sequence and Cricoid pressure planned  PONV Risk Score and Plan: 2  Airway Management Planned: Oral ETT  Additional Equipment: Arterial line, Ultrasound Guidance Line Placement, CVP, PA Cath and TEE  Intra-op Plan:   Post-operative Plan: Post-operative intubation/ventilation  Informed Consent:      Only emergency history available and History available from chart only  Plan Discussed with: CRNA and Surgeon  Anesthesia Plan Comments:          Anesthesia Quick Evaluation

## 2023-11-17 NOTE — Transfer of Care (Signed)
 Immediate Anesthesia Transfer of Care Note  Patient: [name]  Procedure(s) Performed: REPAIR OF ASCENDING AORTIC DISSECTION USING A 23mm KONECT RESILIA AORTIC VALVE CONDUIT AND 32X10MM X 50CM HEMASHIELD PLATINUM GRAFT. (Chest) ECHOCARDIOGRAM, TRANSESOPHAGEAL, INTRAOPERATIVE (Esophagus)  Patient Location: ICU  Anesthesia Type:General  Level of Consciousness: Patient remains intubated per anesthesia plan  Airway & Oxygen Therapy: Patient remains intubated per anesthesia plan and Patient placed on Ventilator (see vital sign flow sheet for setting)  Post-op Assessment: Report given to RN and Post -op Vital signs reviewed and stable  Post vital signs: Reviewed and stable  Last Vitals:  Vitals Value Taken Time  BP 103/58 11/17/23 1848  Temp 35.7 C 11/17/23 1852  Pulse 80 11/17/23 1852  Resp 16 11/17/23 1852  SpO2 98 % 11/17/23 1852  Vitals shown include unfiled device data.  Last Pain:  Vitals:   11/17/23 1022  TempSrc:   PainSc: 2          Complications: No notable events documented.

## 2023-11-17 NOTE — Consult Note (Signed)
 [address]       [place] [address]             [phone]           Davone Shinault [facility] Medical Record #[id number] Date of Birth: 1945/11/21  No ref. provider found Soundra Pilon, FNP  Chief Complaint:    Chief Complaint  Patient presents with   Chest Pain    History of Present Illness:     78 yo male with history of ascending aortic repair followed with serial CT scans. Pt with acute chest pain after shower and came to ER where CT shows acute aortic hematoma of ascending aorta and root. Pt with history of HTN.       Past Medical History:  Diagnosis Date   Aortic regurgitation    Ascending aortic aneurysm (HCC)    Cold sweat    Elevated PSA    Gout    Hypercholesterolemia    Hypertension    Lightheaded    Nauseous     Past Surgical History:  Procedure Laterality Date   BIOPSY PHARYNX  2013   HERNIA REPAIR  2016    Social History   Tobacco Use  Smoking Status Never  Smokeless Tobacco Never    Social History   Substance and Sexual Activity  Alcohol Use Never    Social History   Socioeconomic History   Marital status: Married    Spouse name: Not on file   Number of children: 0   Years of education: Not on file   Highest education level: Not on file  Occupational History   Occupation: [employer]    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)  Tobacco Use   Smoking status: Never   Smokeless tobacco: Never  Vaping Use   Vaping status: Never Used  Substance and Sexual Activity   Alcohol use: Never   Drug use: Never   Sexual activity: Not Currently  Other Topics Concern   Not on file  Social History Narrative   Not on file   Social Drivers of Health   Financial Resource Strain: Not on file  Food Insecurity: Not on file  Transportation Needs: Not on file  Physical Activity: Not on file  Stress: Not on file  Social Connections: Not on file  Intimate Partner Violence: Not on file     No Known Allergies  Current Facility-Administered Medications  Medication Dose Route Frequency Provider Last Rate Last Admin   ceFAZolin (ANCEF) IVPB 2g/100 mL premix  2 g Intravenous To OR Anders Simmonds T, DO       ceFAZolin (ANCEF) IVPB 2g/100 mL premix  2 g Intravenous To OR Anders Simmonds T, DO       dexmedetomidine (PRECEDEX) 400 MCG/100ML (4 mcg/mL) infusion  0.1-0.7 mcg/kg/hr Intravenous To OR Anders Simmonds T, DO       EPINEPHrine (ADRENALIN) 5 mg in NS 250 mL (0.02 mg/mL) premix infusion  0-10 mcg/min Intravenous To OR Anders Simmonds T, DO       heparin 30,000 units/NS 1000 mL solution for CELLSAVER   Other To OR Anders Simmonds T, DO       heparin sodium (porcine) 2,500 Units, papaverine 30 mg in electrolyte-A (PLASMALYTE-A PH 7.4) 500 mL irrigation   Irrigation To OR Anders Simmonds T, DO       insulin regular, human (MYXREDLIN) 100 units/ 100 mL infusion   Intravenous To OR Anders Simmonds T, DO  [facility] Blood Cardioplegia vial (lidocaine/magnesium/mannitol 0.26g-4g-6.4g)   Intracoronary To OR Arletha Pili, DO       [facility] Blood Cardioplegia vial (lidocaine/magnesium/mannitol 7.56E-3P-2.9J)   Intracoronary To OR Anders Simmonds T, DO       milrinone (PRIMACOR) 20 MG/100 ML (0.2 mg/mL) infusion  0.3 mcg/kg/min Intravenous To OR Anders Simmonds T, DO       nicardipine (CARDENE) 20mg  in 0.86% saline IV infusion (0.1 mg/ml)  3-15 mg/hr Intravenous Continuous Anders Simmonds T, DO 150 mL/hr at 11/17/23 1229 15 mg/hr at 11/17/23 1229   nitroGLYCERIN 50 mg in dextrose 5 % 250 mL (0.2 mg/mL) infusion  2-200 mcg/min Intravenous To OR Anders Simmonds T, DO       nitroPRUSSide (NIPRIDE) 20 mg in NS 100 mL (0.2 mg/mL) infusion  0-1 mcg/kg/min Intravenous Continuous Anders Simmonds T, DO 6.12 mL/hr at 11/17/23 1223 0.25 mcg/kg/min at 11/17/23 1223   norepinephrine (LEVOPHED) 4mg  in (0.016 mg/mL) premix infusion  0-40 mcg/min Intravenous To OR Anders Simmonds T,  DO       phenylephrine (NEO-SYNEPHRINE) 20mg /NS premix infusion  30-200 mcg/min Intravenous To OR Anders Simmonds T, DO       potassium chloride injection 80 mEq  80 mEq Other To OR Anders Simmonds T, DO       tranexamic acid (CYKLOKAPRON) 2,500 mg in sodium chloride 0.9 % 250 mL (10 mg/mL) infusion  1.5 mg/kg/hr Intravenous To OR Anders Simmonds T, DO       tranexamic acid (CYKLOKAPRON) bolus via infusion - over 30 minutes 1,224 mg  15 mg/kg Intravenous To OR Anders Simmonds T, DO       tranexamic acid (CYKLOKAPRON) pump prime solution 163 mg  2 mg/kg Intracatheter To OR Anders Simmonds T, DO       vancomycin (VANCOCIN) 1,000 mg in sodium chloride 0.9 % 1,000 mL irrigation   Irrigation To OR Anders Simmonds T, DO       vancomycin (VANCOREADY) IVPB 1250 mg/250 mL  1,250 mg Intravenous To OR Arletha Pili, DO       Current Outpatient Medications  Medication Sig Dispense Refill   acetaminophen (TYLENOL) 500 MG tablet Take 500 mg by mouth as needed.     allopurinol (ZYLOPRIM) 100 MG tablet Take 100 mg by mouth daily.     aspirin EC 81 MG tablet Take 81 mg by mouth daily. Swallow whole.     lisinopril (ZESTRIL) 20 MG tablet Take 20 mg by mouth daily.     Multiple Vitamin (MULTIVITAMIN) tablet Take 1 tablet by mouth daily.     rosuvastatin (CRESTOR) 10 MG tablet Take 1 tablet (10 mg total) by mouth daily. 90 tablet 3   sildenafil (REVATIO) 20 MG tablet Take 20 mg by mouth daily as needed (Erectile dysfunction).     traZODone (DESYREL) 50 MG tablet Take 50 mg by mouth at bedtime as needed for sleep.       Family History  Problem Relation Age of Onset   Osteoarthritis Mother    Heart attack Mother 85   CVA Father    CVA Maternal Grandmother    CVA Maternal Grandfather    CVA Paternal Grandfather    Heart disease Paternal Grandfather        Physical Exam: BP (!) 146/58   Pulse (!) 57   Temp 98.3 F (36.8 C) (Oral)   Resp 11   Ht 5\' 10"  (1.778 m)   Wt 81.6 kg   SpO2 96%  BMI 25.83 kg/m  Lungs: clear Card: RR with diastolic murmur Ext: Warm with good pulses Neuro: intact    Diagnostic Studies & Laboratory data: I have personally reviewed the following studies and agree with the findings     Recent Radiology Findings:   CT Angio Chest/Abd/Pel for Dissection W and/or Wo Contrast Result Date: 11/17/2023 CLINICAL DATA:  Acute aortic syndrome (AAS) suspected EXAM: CT ANGIOGRAPHY CHEST, ABDOMEN AND PELVIS TECHNIQUE: Non-contrast CT of the chest was initially obtained. Multidetector CT imaging through the chest, abdomen and pelvis was performed using the standard protocol during bolus administration of intravenous contrast. Multiplanar reconstructed images and MIPs were obtained and reviewed to evaluate the vascular anatomy. RADIATION DOSE REDUCTION: This exam was performed according to the departmental dose-optimization program which includes automated exposure control, adjustment of the mA and/or kV according to patient size and/or use of iterative reconstruction technique. CONTRAST:  OMNIPAQUE IOHEXOL 350 MG/ML SOLN COMPARISON:  CT Angiography chest from 06/16/2023. FINDINGS: CTA CHEST FINDINGS Cardiovascular: There is acute intramural hematoma involving the aortic root and extending up to 10 cm length into the proximal aortic arch. The ascending aortic arch measures up to 6.2 x 6.3 cm in diameter at this level previously measured up to 5.3 x 5.4 cm, when measured in similar fashion. Findings favor type A intramural hematoma. Normal aortic dissection or penetrating atherosclerotic ulcer. There is satisfactory opacification of the bilateral pulmonary arteries. No embolism seen up to the proximal subsegmental pulmonary artery level. Mild cardiomegaly. No pericardial effusion. No aortic aneurysm. There are coronary artery calcifications, in keeping with coronary artery disease. There are also mild peripheral atherosclerotic vascular calcifications of thoracic aorta and  its major branches. Mediastinum/Nodes: Visualized thyroid gland appears grossly unremarkable. No solid / cystic mediastinal masses. The esophagus is nondistended precluding optimal assessment. No axillary, mediastinal or hilar lymphadenopathy by size criteria. Lungs/Pleura: The central tracheo-bronchial tree is patent. There are dependent changes in bilateral lungs. No mass or consolidation. No pleural effusion or pneumothorax. There are 2, solid noncalcified nodules in the left lower lobe with largest measuring up to 5 x 7 mm. No significant interval change since the prior study dating back to 2022. No further follow-up recommended. No new or suspicious lung nodules seen. Musculoskeletal: The visualized soft tissues of the chest wall are grossly unremarkable. No suspicious osseous lesions. There are mild multilevel degenerative changes in the visualized spine. Review of the MIP images confirms the above findings. CTA ABDOMEN AND PELVIS FINDINGS VASCULAR Aorta: Abdominal aorta is normal in caliber and exhibits moderate to severe peripheral atherosclerotic vascular calcifications. There are at least 2, penetrating atherosclerotic ulcers along the posterior wall of the infrarenal aorta. The largest penetrating atherosclerotic ulcer is inferiorly located and measures up to 9 mm enter posteriorly, 1.8 cm mediolaterally and 1.3 cm cranial caudally. Celiac: Patent without evidence of aneurysm, dissection, vasculitis or significant stenosis. SMA: There is mild narrowing at the origin. The artery is otherwise patent without evidence of aneurysm, dissection, vasculitis or significant stenosis. Renals: Both renal arteries are patent without evidence of aneurysm, dissection, vasculitis, fibromuscular dysplasia or significant stenosis. IMA: Patent without evidence of aneurysm, dissection, vasculitis or significant stenosis. Inflow: Patent without evidence of aneurysm, dissection, vasculitis or significant stenosis. Veins: No  obvious venous abnormality within the limitations of this arterial phase study. Review of the MIP images confirms the above findings. NON-VASCULAR Hepatobiliary: The liver is normal in size. Non-cirrhotic configuration. No suspicious mass. No intrahepatic or extrahepatic bile duct dilation. No calcified gallstones.  Normal gallbladder wall thickness. No pericholecystic inflammatory changes. Pancreas: Unremarkable. No pancreatic ductal dilatation or surrounding inflammatory changes. Spleen: Within normal limits. No focal lesion. Adrenals/Urinary Tract: Adrenal glands are unremarkable. No suspicious renal mass. There is a 1.9 x 2.3 cm simple cyst in the right kidney interpolar region. No hydronephrosis. No renal or ureteric calculi. Unremarkable urinary bladder. Stomach/Bowel: No disproportionate dilation of the small or large bowel loops. No evidence of abnormal bowel wall thickening or inflammatory changes. The appendix is unremarkable. There are multiple diverticula throughout the colon, without imaging signs of diverticulitis. Vascular/Lymphatic: No ascites or pneumoperitoneum. No abdominal or pelvic lymphadenopathy, by size criteria. Reproductive: Enlarged prostate. Symmetric seminal vesicles. Other: There are small fat containing umbilical and left inguinal hernias. The soft tissues and abdominal wall are otherwise unremarkable. Musculoskeletal: No suspicious osseous lesions. There are mild - moderate multilevel degenerative changes in the visualized spine. Review of the MIP images confirms the above findings. IMPRESSION: 1. There is acute intramural hematoma involving the aortic root and extending up to 10 cm length into the proximal aortic arch. The ascending aortic arch measures up to 6.2 x 6.3 cm in diameter at this level, previously measured up to 5.3 x 5.4 cm, when measured in similar fashion. Findings favor type A intramural hematoma. No aortic dissection. 2. There are two penetrating atherosclerotic  ulcers in the infrarenal abdominal aorta, described in detail above. 3. There are stable, two solid noncalcified nodules in the left lower lobe with largest measuring up to 5 x 7 mm. 4. Multiple other nonacute observations, as described above. 5. Aortic atherosclerosis. Aortic Atherosclerosis (ICD10-I70.0). Critical Value/emergent results were called by telephone at the time of interpretation on 11/17/2023 at 12:15 pm to provider [facility] , who verbally acknowledged these results. Electronically Signed   By: Jules Schick M.D.   On: 11/17/2023 12:26      Recent Lab Findings: Lab Results  Component Value Date   WBC 8.4 11/17/2023   HGB 16.0 11/17/2023   HCT 47.5 11/17/2023   PLT 152 11/17/2023   GLUCOSE 87 11/17/2023   CHOL 175 06/09/2023   TRIG 108 06/09/2023   HDL 62 06/09/2023   LDLCALC 94 06/09/2023   ALT 11 06/09/2023   AST 18 06/09/2023   NA 137 11/17/2023   K 5.1 11/17/2023   CL 104 11/17/2023   CREATININE 0.95 11/17/2023   BUN 24 (H) 11/17/2023   CO2 20 (L) 11/17/2023      Assessment / Plan:     Acute ascending aortic hematoma in pt with previous ascending aortic aneurysm. Pt requires emergency aortic replacement. All the risks and goals an recovery discussed an pt understands and wishes to proceed.    I have spent 60 min in review of the records, viewing studies and in face to face with patient and in coordination of future care    Eugenio Hoes 11/17/2023 12:32 PM

## 2023-11-17 NOTE — ED Notes (Signed)
 Patient transported to CT

## 2023-11-17 NOTE — Anesthesia Procedure Notes (Signed)
 Procedure Name: Intubation Date/Time: 11/17/2023 12:54 PM  Performed by: Samara Deist, CRNAPre-anesthesia Checklist: Patient identified, Emergency Drugs available, Suction available and Patient being monitored Patient Re-evaluated:Patient Re-evaluated prior to induction Oxygen Delivery Method: Circle system utilized Preoxygenation: Pre-oxygenation with 100% oxygen Induction Type: IV induction Ventilation: Mask ventilation without difficulty Laryngoscope Size: Mac and 4 Grade View: Grade II Tube type: Oral Tube size: 8.0 mm Number of attempts: 1 Airway Equipment and Method: Stylet and Oral airway Placement Confirmation: ETT inserted through vocal cords under direct vision, positive ETCO2 and breath sounds checked- equal and bilateral Secured at: 22 cm Tube secured with: Tape Dental Injury: Teeth and Oropharynx as per pre-operative assessment

## 2023-11-17 NOTE — Anesthesia Procedure Notes (Signed)
 Arterial Line Insertion Start/End3/13/2025 12:50 PM Performed by: Alease Medina, CRNA  Patient location: OR. Preanesthetic checklist: patient identified, IV checked, site marked, risks and benefits discussed, surgical consent, monitors and equipment checked, pre-op evaluation, timeout performed and anesthesia consent Lidocaine 1% used for infiltration Left, radial was placed Catheter size: 20 G Hand hygiene performed  and maximum sterile barriers used   Attempts: 2 Procedure performed without using ultrasound guided technique. Following insertion, dressing applied and Biopatch. Post procedure assessment: normal and unchanged  Patient tolerated the procedure well with no immediate complications.

## 2023-11-17 NOTE — Anesthesia Procedure Notes (Signed)
 Central Venous Catheter Insertion Performed by: Val Eagle, MD, anesthesiologist Start/End3/13/2025 12:48 PM, 11/17/2023 12:58 PM Patient location: OR. Emergency situation Preanesthetic checklist: patient identified, IV checked and monitors and equipment checked Patient sedated Hand hygiene performed  and maximum sterile barriers used  Catheter size: 9 Fr Total catheter length 10. Central line was placed.MAC introducer Procedure performed using ultrasound guided technique. Ultrasound Notes:anatomy identified, needle tip was noted to be adjacent to the nerve/plexus identified, no ultrasound evidence of intravascular and/or intraneural injection and image(s) printed for medical record Attempts: 1 Following insertion, line sutured and dressing applied. Post procedure assessment: blood return through all ports, free fluid flow and no air  Patient tolerated the procedure well with no immediate complications.

## 2023-11-17 NOTE — Hospital Course (Addendum)
 HPI: This is a 78 year old male who presented with chest pain that radiated to the neck. CTA showed acute intramural hematoma involving the aortic root and extending up to 10 cm length into the proximal aortic arch. The ascending aortic arch measures up to 6.2 x 6.3 cm in diameter at this level, previously measured up to 5.3 x 5.4 cm. Dr. Leafy Ro discussed with the patient the need for emergent aortic replacement. Of note, the patient has a known ATAA, is followed by Dr. Flora Lipps, and last CTA done in October 2024 showed a fusiform dilatation of the ascending aorta 5.2 x 5.1 cm.  Potential risks, benefits, and complications of the surgery were discussed with the patient and he agreed to proceed with surgery.  Hospital Course: Patient underwent emergent repair of  acute intramural ascending aortic hematoma in a patient with previous ascending aortic aneurysm. He was transported from the OR to [facility] ICU in stable condition but he is critically ill. He was extubated this am 03/14. He was on back up pacer (VVI at 40). He was transfused on 03/14. He had thrombocytopenia. Platelets went as low as 97,000. Thrombocytopenia resolved later in his post op course as his platelet count on 03/18 was up to 227,000. He was on Neo Synephrine drip and this was weaned as able. He was given low dose Amiodarone for a fib prophylaxis. He was put on Nitroglycerin drip for hypertension and this was stopped on 03/15. Lopressor was titrated accordingly and he was restarted on Lisinopril 03/15. Chest tubes were removed on 03/16. He was above his pre op weight and had LE edema so he was given Lasix. He was stable for transfer from the ICU to 4E for further convalescence on 03/17. PT/OT were consulted for physical deconditioning. He started having PACs and appeared as though he might go into a fib. Oral Amiodarone was increased to 400 mg bid. Lopressor was also increased to 25 mg bid. He was started on oral Iron as H and H 03/18 was slightly  decreased to 7.6 and 22.7. He was ambulating on room air with good oxygenation. His wounds are clean, dry, healing without signs of infection. He has been tolerating a diet and has had a bowel movement.

## 2023-11-17 NOTE — ED Notes (Signed)
 Awaiting Nipride from main pharmacy.  Main pharmacy messaged about sending as soon as possible.  DO Andria Meuse aware.

## 2023-11-17 NOTE — Consult Note (Signed)
 NAME:  Kristopher Flores, MRN:  [id number], DOB:  1945-11-02, LOS: 0 ADMISSION DATE:  11/17/2023, CONSULTATION DATE:  3/13 REFERRING MD:  Leafy Ro, CHIEF COMPLAINT:  aortic diseection   History of Present Illness:  78 year old male w/ known h/o abd aortic aneurysm being followed and was stable as of Dec 2024. Presents to the ER acutely at [facility] reporting acute onset of chest pain while getting out of shower.  Initial sbp in 140s CT chest acute intramural hematoma involving aortic root that extended up to the proximal aortic arch c/w type A intramural hematoma but no aortic dissection  Additional eval in ER: Baseline cr 0.95, hgb 16 hct 47.5, Trop I neg  Seen by Dr Erlene Senters to OR for emergent aortic replacement   Pump time: 2h 72m Xclamp time: 2h 25m EBL: 1.6cc Cell Saver: 860cc Products: 2 plt, 2 ffp, 1 cryo, no pRBC Urine: 1L  Pertinent  Medical History  Known ascending aortic aneurysm (had been followed and stable for 3 years as of 12/24).  Coronary calcifications, HLD, HTN  Significant Hospital Events: Including procedures, antibiotic start and stop dates in addition to other pertinent events   3/13: aortic hematoma s/p repair in ICU post op  Interim History / Subjective:  Post op into ICU   Objective   Blood pressure 112/68, pulse 82, temperature 98.3 F (36.8 C), temperature source Oral, resp. rate 18, height 5\' 10"  (1.778 m), weight 81.6 kg, SpO2 98%.        Intake/Output Summary (Last 24 hours) at 11/17/2023 1536 Last data filed at 11/17/2023 1510 Gross per 24 hour  Intake 1250 ml  Output 425 ml  Net 825 ml   Filed Weights   11/17/23 0957  Weight: 81.6 kg    Examination: General: older male, laying flat in bed, intubated, no acute distress HENT: [place]/at, perrla, mmm, ett  Lungs: clear bilaterally  Cardiovascular: s1s2, rrr, slight click from graft, chest tubes with bloody output Abdomen: flat, soft Extremities: no edema Neuro: sedated GU:  foley  Resolved Hospital Problem list    Assessment & Plan:  Acute ascending aortic hematoma S/p emergent aortic root and ascending aortic replacement  - post-op management per TCTS - rapid wean per TCTS protocol - VAP/ PPI - CXR/ ABG, CBC, BMET now - SBP goal 100-110  - tele monitoring/ pacing prn - mediastinal drains per TCTS - multimodal pain control per protocol- oxycodone, tramadol, morphine with bowel regimen - ASA, statin to resume next day  - metoprolol BID once off pressors - complete post-op antibiotics - monitor electrolytes, replete PRN  - monitor neuro status - had retrograde flow on xclamp  Post-op vent management  - full mechanical vent support - lung protective ventilation 6-8cc/kg Vt - VAP and PAD bundle in place  - titrate FiO2 to sat goal >92  - maintain peak/plats <30, driving pressures <40    Expected surgical blood loss anemia Last hgb 10.2 - trend  - tranfuse per protocl   History of hypertension  - hold medications at this time  - IV meds for SBP goal 100-110   Best Practice (right click and "Reselect all SmartList Selections" daily)   Diet/type: NPO DVT prophylaxis SCD Pressure ulcer(s): N/A GI prophylaxis: PPI Lines: Central line, Arterial Line, and yes and it is still needed Foley:  Yes, and it is still needed Code Status:  full code Last date of multidisciplinary goals of care discussion [per primary]  Labs   CBC: Recent Labs  Lab 11/17/23 0923 11/17/23 1400 11/17/23 1404 11/17/23 1456  WBC 8.4  --   --   --   HGB 16.0 9.9* 10.5* 11.2*  HCT 47.5 29.0* 31.0* 33.0*  MCV 89.6  --   --   --   PLT 152  --   --   --     Basic Metabolic Panel: Recent Labs  Lab 11/17/23 0923 11/17/23 1400 11/17/23 1404 11/17/23 1456  NA 137 135 135 135  K 5.1 4.5 4.5 4.4  CL 104 101  --  103  CO2 20*  --   --   --   GLUCOSE 87 133*  --  151*  BUN 24* 20  --  21  CREATININE 0.95 0.80  --  0.90  CALCIUM 8.1*  --   --   --     GFR: Estimated Creatinine Clearance: 69.8 mL/min (by C-G formula based on SCr of 0.9 mg/dL). Recent Labs  Lab 11/17/23 0923  WBC 8.4    Liver Function Tests: No results for input(s): "AST", "ALT", "ALKPHOS", "BILITOT", "PROT", "ALBUMIN" in the last 168 hours. No results for input(s): "LIPASE", "AMYLASE" in the last 168 hours. No results for input(s): "AMMONIA" in the last 168 hours.  ABG    Component Value Date/Time   PHART 7.344 (L) 11/17/2023 1404   PCO2ART 38.0 11/17/2023 1404   PO2ART 360 (H) 11/17/2023 1404   HCO3 20.7 11/17/2023 1404   TCO2 27 11/17/2023 1456   ACIDBASEDEF 5.0 (H) 11/17/2023 1404   O2SAT 100 11/17/2023 1404     Coagulation Profile: No results for input(s): "INR", "PROTIME" in the last 168 hours.  Cardiac Enzymes: No results for input(s): "CKTOTAL", "CKMB", "CKMBINDEX", "TROPONINI" in the last 168 hours.  HbA1C: No results found for: "HGBA1C"  CBG: No results for input(s): "GLUCAP" in the last 168 hours.  Review of Systems:   As above  Past Medical History:  He,  has a past medical history of Aortic regurgitation, Ascending aortic aneurysm (HCC), Cold sweat, Elevated PSA, Gout, Hypercholesterolemia, Hypertension, Lightheaded, and Nauseous.   Surgical History:   Past Surgical History:  Procedure Laterality Date   BIOPSY PHARYNX  2013   HERNIA REPAIR  2016     Social History:   reports that he has never smoked. He has never used smokeless tobacco. He reports that he does not drink alcohol and does not use drugs.   Family History:  His family history includes CVA in his father, maternal grandfather, maternal grandmother, and paternal grandfather; Heart attack (age of onset: 62) in his mother; Heart disease in his paternal grandfather; Osteoarthritis in his mother.   Allergies No Known Allergies   Home Medications  Prior to Admission medications   Medication Sig Start Date End Date Taking? Authorizing Provider  acetaminophen  (TYLENOL) 500 MG tablet Take 500 mg by mouth as needed.    [provider]  allopurinol (ZYLOPRIM) 100 MG tablet Take 100 mg by mouth daily.    [provider]  aspirin EC 81 MG tablet Take 81 mg by mouth daily. Swallow whole.    [provider]  lisinopril (ZESTRIL) 20 MG tablet Take 20 mg by mouth daily.    [provider]  Multiple Vitamin (MULTIVITAMIN) tablet Take 1 tablet by mouth daily.    [provider]  rosuvastatin (CRESTOR) 10 MG tablet Take 1 tablet (10 mg total) by mouth daily. 06/06/23   Reather Littler D, NP  sildenafil (REVATIO) 20 MG tablet Take 20  mg by mouth daily as needed (Erectile dysfunction).    [provider]  traZODone (DESYREL) 50 MG tablet Take 50 mg by mouth at bedtime as needed for sleep.    [provider]     Critical care time: 6     Lenard Galloway [facility] 11/17/23 6:29 PM  Please see [email or web address] for pager details.  From 7A-7P if no response, please call [phone] After hours, please call ELink [phone]

## 2023-11-17 NOTE — Progress Notes (Signed)
  Echocardiogram Echocardiogram Transesophageal has been performed.  Delcie Roch 11/17/2023, 2:33 PM

## 2023-11-17 NOTE — ED Provider Notes (Signed)
 [place] EMERGENCY DEPARTMENT AT [facility] Provider Note   CSN: [id number] Arrival date & time: 11/17/23  5638     History  Chief Complaint  Patient presents with   Chest Pain    Mauricio Dahlen is a 78 y.o. male.  78 year old male here today for chest pain.  Patient says that he had just gotten out of the shower, began to have pain over the left side of his chest with radiation up into his neck.  Patient has a history of a thoracic aortic aneurysm, 52 mm.  EMS arrived, patient took aspirin, nitro.  Says the pain does feel a bit better.   Chest Pain      Home Medications Prior to Admission medications   Medication Sig Start Date End Date Taking? Authorizing Provider  acetaminophen (TYLENOL) 500 MG tablet Take 500 mg by mouth as needed.    [provider]  allopurinol (ZYLOPRIM) 100 MG tablet Take 100 mg by mouth daily.    [provider]  aspirin EC 81 MG tablet Take 81 mg by mouth daily. Swallow whole.    [provider]  lisinopril (ZESTRIL) 20 MG tablet Take 20 mg by mouth daily.    [provider]  Multiple Vitamin (MULTIVITAMIN) tablet Take 1 tablet by mouth daily.    [provider]  rosuvastatin (CRESTOR) 10 MG tablet Take 1 tablet (10 mg total) by mouth daily. 06/06/23   Reather Littler D, NP  sildenafil (REVATIO) 20 MG tablet Take 20 mg by mouth daily as needed (Erectile dysfunction).    [provider]  traZODone (DESYREL) 50 MG tablet Take 50 mg by mouth at bedtime as needed for sleep.    [provider]      Allergies    Patient has no known allergies.    Review of Systems   Review of Systems  Cardiovascular:  Positive for chest pain.    Physical Exam Updated Vital Signs BP (!) 146/58   Pulse (!) 57   Temp 98.3 F (36.8 C) (Oral)   Resp 11   Ht 5\' 10"  (1.778 m)   Wt 81.6 kg   SpO2 96%   BMI 25.83 kg/m  Physical Exam Vitals reviewed.  Cardiovascular:     Rate and Rhythm:  Regular rhythm. Bradycardia present.     Pulses:          Carotid pulses are 2+ on the right side and 2+ on the left side.      Radial pulses are 2+ on the right side and 2+ on the left side.       Dorsalis pedis pulses are 2+ on the right side and 2+ on the left side.       Posterior tibial pulses are 2+ on the right side and 2+ on the left side.     Heart sounds: Normal heart sounds.     Comments: Palpable PT pulses bilaterally Musculoskeletal:     Cervical back: Normal range of motion and neck supple.  Neurological:     Mental Status: He is alert.     ED Results / Procedures / Treatments   Labs (all labs ordered are listed, but only abnormal results are displayed) Labs Reviewed  BASIC METABOLIC PANEL - Abnormal; Notable for the following components:      Result Value   CO2 20 (*)    BUN 24 (*)    Calcium 8.1 (*)    All other components within normal limits  CBC  TYPE AND SCREEN  TROPONIN I (HIGH SENSITIVITY)  TROPONIN I (HIGH SENSITIVITY)    EKG None  Radiology CT Angio Chest/Abd/Pel for Dissection W and/or Wo Contrast Result Date: 11/17/2023 CLINICAL DATA:  Acute aortic syndrome (AAS) suspected EXAM: CT ANGIOGRAPHY CHEST, ABDOMEN AND PELVIS TECHNIQUE: Non-contrast CT of the chest was initially obtained. Multidetector CT imaging through the chest, abdomen and pelvis was performed using the standard protocol during bolus administration of intravenous contrast. Multiplanar reconstructed images and MIPs were obtained and reviewed to evaluate the vascular anatomy. RADIATION DOSE REDUCTION: This exam was performed according to the departmental dose-optimization program which includes automated exposure control, adjustment of the mA and/or kV according to patient size and/or use of iterative reconstruction technique. CONTRAST:  OMNIPAQUE IOHEXOL 350 MG/ML SOLN COMPARISON:  CT Angiography chest from 06/16/2023. FINDINGS: CTA CHEST FINDINGS Cardiovascular: There is acute  intramural hematoma involving the aortic root and extending up to 10 cm length into the proximal aortic arch. The ascending aortic arch measures up to 6.2 x 6.3 cm in diameter at this level previously measured up to 5.3 x 5.4 cm, when measured in similar fashion. Findings favor type A intramural hematoma. Normal aortic dissection or penetrating atherosclerotic ulcer. There is satisfactory opacification of the bilateral pulmonary arteries. No embolism seen up to the proximal subsegmental pulmonary artery level. Mild cardiomegaly. No pericardial effusion. No aortic aneurysm. There are coronary artery calcifications, in keeping with coronary artery disease. There are also mild peripheral atherosclerotic vascular calcifications of thoracic aorta and its major branches. Mediastinum/Nodes: Visualized thyroid gland appears grossly unremarkable. No solid / cystic mediastinal masses. The esophagus is nondistended precluding optimal assessment. No axillary, mediastinal or hilar lymphadenopathy by size criteria. Lungs/Pleura: The central tracheo-bronchial tree is patent. There are dependent changes in bilateral lungs. No mass or consolidation. No pleural effusion or pneumothorax. There are 2, solid noncalcified nodules in the left lower lobe with largest measuring up to 5 x 7 mm. No significant interval change since the prior study dating back to 2022. No further follow-up recommended. No new or suspicious lung nodules seen. Musculoskeletal: The visualized soft tissues of the chest wall are grossly unremarkable. No suspicious osseous lesions. There are mild multilevel degenerative changes in the visualized spine. Review of the MIP images confirms the above findings. CTA ABDOMEN AND PELVIS FINDINGS VASCULAR Aorta: Abdominal aorta is normal in caliber and exhibits moderate to severe peripheral atherosclerotic vascular calcifications. There are at least 2, penetrating atherosclerotic ulcers along the posterior wall of the  infrarenal aorta. The largest penetrating atherosclerotic ulcer is inferiorly located and measures up to 9 mm enter posteriorly, 1.8 cm mediolaterally and 1.3 cm cranial caudally. Celiac: Patent without evidence of aneurysm, dissection, vasculitis or significant stenosis. SMA: There is mild narrowing at the origin. The artery is otherwise patent without evidence of aneurysm, dissection, vasculitis or significant stenosis. Renals: Both renal arteries are patent without evidence of aneurysm, dissection, vasculitis, fibromuscular dysplasia or significant stenosis. IMA: Patent without evidence of aneurysm, dissection, vasculitis or significant stenosis. Inflow: Patent without evidence of aneurysm, dissection, vasculitis or significant stenosis. Veins: No obvious venous abnormality within the limitations of this arterial phase study. Review of the MIP images confirms the above findings. NON-VASCULAR Hepatobiliary: The liver is normal in size. Non-cirrhotic configuration. No suspicious mass. No intrahepatic or extrahepatic bile duct dilation. No calcified gallstones. Normal gallbladder wall thickness. No pericholecystic inflammatory changes. Pancreas: Unremarkable. No pancreatic ductal dilatation or surrounding inflammatory changes. Spleen: Within normal limits. No focal lesion.  Adrenals/Urinary Tract: Adrenal glands are unremarkable. No suspicious renal mass. There is a 1.9 x 2.3 cm simple cyst in the right kidney interpolar region. No hydronephrosis. No renal or ureteric calculi. Unremarkable urinary bladder. Stomach/Bowel: No disproportionate dilation of the small or large bowel loops. No evidence of abnormal bowel wall thickening or inflammatory changes. The appendix is unremarkable. There are multiple diverticula throughout the colon, without imaging signs of diverticulitis. Vascular/Lymphatic: No ascites or pneumoperitoneum. No abdominal or pelvic lymphadenopathy, by size criteria. Reproductive: Enlarged prostate.  Symmetric seminal vesicles. Other: There are small fat containing umbilical and left inguinal hernias. The soft tissues and abdominal wall are otherwise unremarkable. Musculoskeletal: No suspicious osseous lesions. There are mild - moderate multilevel degenerative changes in the visualized spine. Review of the MIP images confirms the above findings. IMPRESSION: 1. There is acute intramural hematoma involving the aortic root and extending up to 10 cm length into the proximal aortic arch. The ascending aortic arch measures up to 6.2 x 6.3 cm in diameter at this level, previously measured up to 5.3 x 5.4 cm, when measured in similar fashion. Findings favor type A intramural hematoma. No aortic dissection. 2. 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 3. There are stable, two solid noncalcified nodules in the left lower lobe with largest measuring up to 5 x 7 mm. 4. Multiple other nonacute observations, as described above. 5. Aortic atherosclerosis. Aortic Atherosclerosis (ICD10-I70.0). Critical Value/emergent results were called by telephone at the time of interpretation on 11/17/2023 at 12:15 pm to provider [facility] , who verbally acknowledged these results. Electronically Signed   By: Jules Schick M.D.   On: 11/17/2023 12:26    Procedures .Critical Care  Performed by: Arletha Pili, DO Authorized by: Arletha Pili, DO   Critical care provider statement:    Critical care time (minutes):  80   Critical care was necessary to treat or prevent imminent or life-threatening deterioration of the following conditions:  Circulatory failure and cardiac failure   Critical care was time spent personally by me on the following activities:  Development of treatment plan with patient or surrogate, discussions with consultants, evaluation of patient's response to treatment, examination of patient, ordering and review of laboratory studies,  ordering and review of radiographic studies, ordering and performing treatments and interventions, pulse oximetry, re-evaluation of patient's condition and review of old charts   I assumed direction of critical care for this patient from another provider in my specialty: no     Care discussed with: admitting provider       Medications Ordered in ED Medications  nicardipine (CARDENE) 20mg  in 0.86% saline IV infusion (0.1 mg/ml) (15 mg/hr Intravenous New Bag/Given 11/17/23 1229)  nitroPRUSSide (NIPRIDE) 20 mg in NS 100 mL (0.2 mg/mL) infusion (0.25 mcg/kg/min  81.6 kg Intravenous New Bag/Given 11/17/23 1223)  dexmedetomidine (PRECEDEX) 400 MCG/100ML (4 mcg/mL) infusion (has no administration in time range)  insulin regular, human (MYXREDLIN) 100 units/ 100 mL infusion (has no administration in time range)  EPINEPHrine (ADRENALIN) 5 mg in NS 250 mL (0.02 mg/mL) premix infusion (has no administration in time range)  milrinone (PRIMACOR) 20 MG/100 ML (0.2 mg/mL) infusion (has no administration in time range)  nitroGLYCERIN 50 mg in dextrose 5 % 250 mL (0.2 mg/mL) infusion (has no administration in time range)  norepinephrine (LEVOPHED) 4mg  in (0.016 mg/mL) premix infusion (has no administration in time range)  phenylephrine (NEO-SYNEPHRINE) 20mg /NS premix infusion (  has no administration in time range)  potassium chloride injection 80 mEq (has no administration in time range)  heparin 30,000 units/NS 1000 mL solution for CELLSAVER (has no administration in time range)  heparin sodium (porcine) 2,500 Units, papaverine 30 mg in electrolyte-A (PLASMALYTE-A PH 7.4) 500 mL irrigation (has no administration in time range)  tranexamic acid (CYKLOKAPRON) pump prime solution 163 mg (has no administration in time range)  tranexamic acid (CYKLOKAPRON) bolus via infusion - over 30 minutes 1,224 mg (has no administration in time range)  tranexamic acid (CYKLOKAPRON) 2,500 mg in sodium chloride  0.9 % 250 mL (10 mg/mL) infusion (has no administration in time range)  vancomycin (VANCOREADY) IVPB 1250 mg/250 mL (has no administration in time range)  ceFAZolin (ANCEF) IVPB 2g/100 mL premix (has no administration in time range)  ceFAZolin (ANCEF) IVPB 2g/100 mL premix (has no administration in time range)  [facility] Blood Cardioplegia vial (lidocaine/magnesium/mannitol 0.26g-4g-6.4g) (has no administration in time range)  [facility] Blood Cardioplegia vial (lidocaine/magnesium/mannitol 0.26g-4g-6.4g) (has no administration in time range)  vancomycin (VANCOCIN) 1,000 mg in sodium chloride 0.9 % 1,000 mL irrigation (has no administration in time range)  morphine (PF) 4 MG/ML injection 4 mg (4 mg Intravenous Given 11/17/23 0956)  iohexol (OMNIPAQUE) 350 MG/ML injection 100 mL (100 mLs Intravenous Contrast Given 11/17/23 1116)    ED Course/ Medical Decision Making/ A&P                                 Medical Decision Making 78 year old male here today for chest pain.  Differential diagnose include ACS, aortic aneurysm, aortic dissection, less likely PE.  Plan- patient hypertensive, chest pain has improved.  Will provide him with some morphine for the double effect of pain reduction hopefully small amount of BP reduction.  Considered starting the patient on BP controlling agents, however based on his exam do not believe that is indicated at this time.  Patient bradycardic, consistent with prior.  Likely would start the patient on nicardipine.  Low threshold to initiate this medication.  Initial EKG, my independent review of the patient's EKG shows no ST segment depressions or elevations, no T wave inversions, no evidence of acute ischemia.  Reassessment 11 AM-went to CT imaging with the patient, do of concern for a mural thrombus looking at the patient's CT imaging.  Ordered nicardipine drip.  Called radiology for emergent read of the patient's CTA.  Have placed a vascular surgery consult.   Patient's clinical status remains the same.  Patient's current blood pressure 169 systolic, continue to titrate up on our nicardipine.  11:50 AM-called back to [facility] radiology about getting this patient a stat CT read.  Radiology clerk that I spoke with told me that she would again escalate it.  Have asked unit secretary to re-page vascular surgery.  11:55 AM-CT surgery consult placed.  12:10 AM-CT surgery down to evaluate the patient.  They are planning to take the patient to the OR for a type I mural hematoma.  Patient's blood pressure much better controlled, most recent systolic 130.  Patient has 2 IVs, type and screen sent.  He will be admitted to CT surgery service.   Amount and/or Complexity of Data Reviewed Labs: ordered. Radiology: ordered.  Risk Prescription drug management.           Final Clinical Impression(s) / ED Diagnoses Final diagnoses:  Intramural aortic hematoma (HCC)    Rx / DC Orders  ED Discharge Orders     None         Arletha Pili, DO 11/17/23 1235

## 2023-11-17 NOTE — Brief Op Note (Addendum)
 11/17/2023  4:55 PM  PATIENT:  Kristopher Flores  78 y.o. male Acute intramural ascending aortic hematoma 2. Previously known ascending aortic aneurysm   POST-OPERATIVE DIAGNOSIS:  1. Acute intramural ascending aortic hematoma 2. Previously known ascending aortic aneurysm   PROCEDURE:  INTRAOPERATIVE TRANSESOPHAGEAL ECHOCARDIOGRAM, REPAIR OF ASCENDING AORTA USING A 23mm KONECT RESILIA AORTIC VALVE CONDUIT (Model # 11060A, Serial # [id number],Size 23 mm) AND 32X10MM X 50CM HEMASHIELD PLATINUM GRAFT, REINSERTION of CORONARY ARTERIES with CIRCULATORY ARREST.   SURGEON:  Surgeons and Role: Panel 1:    Eugenio Hoes, MD - Primary Panel 2:    Eugenio Hoes, MD - Primary  PHYSICIAN ASSISTANT: Doree Fudge PA-C  ASSISTANTS: Dorthey Sawyer RNFA   ANESTHESIA:   general  EBL:  Per anesthesia, perfusion record  DRAINS:  Chest tubes placed in the mediastinal and pleural spaces    SPECIMEN: Native aortic valve leaflets and ascending thoracic aorta aneurysm  DISPOSITION OF SPECIMEN:  PATHOLOGY  COUNTS CORRECT:  YES  DICTATION: .Dragon Dictation  PLAN OF CARE: Admit to inpatient   PATIENT DISPOSITION:  ICU - intubated and critically ill.   Delay start of Pharmacological VTE agent (>24hrs) due to surgical blood loss or risk of bleeding: yes  BASELINE WEIGHT: 81.6 kg

## 2023-11-17 NOTE — ED Triage Notes (Addendum)
 Pt BIB by EMS reports left-sided CP while showering this morning that radiates into neck. Hx of HTN/HLD and an enlarged aorta. Took 324 mg ASA prior to EMS arrival. Denies any other blood thinner use. Pain 2/10.

## 2023-11-18 ENCOUNTER — Inpatient Hospital Stay (HOSPITAL_COMMUNITY)

## 2023-11-18 ENCOUNTER — Encounter (HOSPITAL_COMMUNITY): Payer: Self-pay | Admitting: Thoracic Surgery (Cardiothoracic Vascular Surgery)

## 2023-11-18 DIAGNOSIS — J9601 Acute respiratory failure with hypoxia: Secondary | ICD-10-CM | POA: Diagnosis not present

## 2023-11-18 DIAGNOSIS — D62 Acute posthemorrhagic anemia: Secondary | ICD-10-CM | POA: Diagnosis not present

## 2023-11-18 LAB — GLUCOSE, CAPILLARY
Glucose-Capillary: 102 mg/dL — ABNORMAL HIGH (ref 70–99)
Glucose-Capillary: 121 mg/dL — ABNORMAL HIGH (ref 70–99)
Glucose-Capillary: 131 mg/dL — ABNORMAL HIGH (ref 70–99)
Glucose-Capillary: 131 mg/dL — ABNORMAL HIGH (ref 70–99)
Glucose-Capillary: 131 mg/dL — ABNORMAL HIGH (ref 70–99)
Glucose-Capillary: 133 mg/dL — ABNORMAL HIGH (ref 70–99)
Glucose-Capillary: 136 mg/dL — ABNORMAL HIGH (ref 70–99)
Glucose-Capillary: 137 mg/dL — ABNORMAL HIGH (ref 70–99)
Glucose-Capillary: 139 mg/dL — ABNORMAL HIGH (ref 70–99)
Glucose-Capillary: 149 mg/dL — ABNORMAL HIGH (ref 70–99)
Glucose-Capillary: 166 mg/dL — ABNORMAL HIGH (ref 70–99)
Glucose-Capillary: 83 mg/dL (ref 70–99)

## 2023-11-18 LAB — CBC
HCT: 24.5 % — ABNORMAL LOW (ref 39.0–52.0)
HCT: 24.7 % — ABNORMAL LOW (ref 39.0–52.0)
Hemoglobin: 8.4 g/dL — ABNORMAL LOW (ref 13.0–17.0)
Hemoglobin: 8.4 g/dL — ABNORMAL LOW (ref 13.0–17.0)
MCH: 30.2 pg (ref 26.0–34.0)
MCH: 30 pg (ref 26.0–34.0)
MCHC: 34.3 g/dL (ref 30.0–36.0)
MCHC: 34 g/dL (ref 30.0–36.0)
MCV: 88.1 fL (ref 80.0–100.0)
MCV: 88.2 fL (ref 80.0–100.0)
Platelets: 105 10*3/uL — ABNORMAL LOW (ref 150–400)
Platelets: 101 10*3/uL — ABNORMAL LOW (ref 150–400)
RBC: 2.78 MIL/uL — ABNORMAL LOW (ref 4.22–5.81)
RBC: 2.8 MIL/uL — ABNORMAL LOW (ref 4.22–5.81)
RDW: 12.7 % (ref 11.5–15.5)
RDW: 14.4 % (ref 11.5–15.5)
WBC: 14.9 10*3/uL — ABNORMAL HIGH (ref 4.0–10.5)
WBC: 18.2 10*3/uL — ABNORMAL HIGH (ref 4.0–10.5)
nRBC: 0 % (ref 0.0–0.2)
nRBC: 0 % (ref 0.0–0.2)

## 2023-11-18 LAB — CBC WITH DIFFERENTIAL/PLATELET
Abs Immature Granulocytes: 0.05 10*3/uL (ref 0.00–0.07)
Basophils Absolute: 0 10*3/uL (ref 0.0–0.1)
Basophils Relative: 0 %
Eosinophils Absolute: 0 10*3/uL (ref 0.0–0.5)
Eosinophils Relative: 0 %
HCT: 27.2 % — ABNORMAL LOW (ref 39.0–52.0)
Hemoglobin: 9.2 g/dL — ABNORMAL LOW (ref 13.0–17.0)
Immature Granulocytes: 0 %
Lymphocytes Relative: 5 %
Lymphs Abs: 0.6 10*3/uL — ABNORMAL LOW (ref 0.7–4.0)
MCH: 30.2 pg (ref 26.0–34.0)
MCHC: 33.8 g/dL (ref 30.0–36.0)
MCV: 89.2 fL (ref 80.0–100.0)
Monocytes Absolute: 0.4 10*3/uL (ref 0.1–1.0)
Monocytes Relative: 4 %
Neutro Abs: 10.7 10*3/uL — ABNORMAL HIGH (ref 1.7–7.7)
Neutrophils Relative %: 91 %
Platelets: 92 10*3/uL — ABNORMAL LOW (ref 150–400)
RBC: 3.05 MIL/uL — ABNORMAL LOW (ref 4.22–5.81)
RDW: 12.7 % (ref 11.5–15.5)
WBC: 11.7 10*3/uL — ABNORMAL HIGH (ref 4.0–10.5)
nRBC: 0 % (ref 0.0–0.2)

## 2023-11-18 LAB — BASIC METABOLIC PANEL
Anion gap: 7 (ref 5–15)
Anion gap: 6 (ref 5–15)
Anion gap: 9 (ref 5–15)
BUN: 17 mg/dL (ref 8–23)
BUN: 17 mg/dL (ref 8–23)
BUN: 16 mg/dL (ref 8–23)
CO2: 23 mmol/L (ref 22–32)
CO2: 22 mmol/L (ref 22–32)
CO2: 22 mmol/L (ref 22–32)
Calcium: 7.9 mg/dL — ABNORMAL LOW (ref 8.9–10.3)
Calcium: 7.9 mg/dL — ABNORMAL LOW (ref 8.9–10.3)
Calcium: 7.9 mg/dL — ABNORMAL LOW (ref 8.9–10.3)
Chloride: 107 mmol/L (ref 98–111)
Chloride: 108 mmol/L (ref 98–111)
Chloride: 107 mmol/L (ref 98–111)
Creatinine, Ser: 1.01 mg/dL (ref 0.61–1.24)
Creatinine, Ser: 1.13 mg/dL (ref 0.61–1.24)
Creatinine, Ser: 1.12 mg/dL (ref 0.61–1.24)
GFR, Estimated: >60 mL/min (ref >60)
GFR, Estimated: >60 mL/min (ref >60)
GFR, Estimated: >60 mL/min (ref >60)
Glucose, Bld: 138 mg/dL — ABNORMAL HIGH (ref 70–99)
Glucose, Bld: 142 mg/dL — ABNORMAL HIGH (ref 70–99)
Glucose, Bld: 136 mg/dL — ABNORMAL HIGH (ref 70–99)
Potassium: 4.3 mmol/L (ref 3.5–5.1)
Potassium: 4.2 mmol/L (ref 3.5–5.1)
Potassium: 4.3 mmol/L (ref 3.5–5.1)
Sodium: 137 mmol/L (ref 135–145)
Sodium: 136 mmol/L (ref 135–145)
Sodium: 138 mmol/L (ref 135–145)

## 2023-11-18 LAB — BPAM FFP
Blood Product Expiration Date: 202503172359
Blood Product Expiration Date: 202503172359
Blood Product Expiration Date: 202503182359
ISSUE DATE / TIME: 202503131703
ISSUE DATE / TIME: 202503131703
Unit Type and Rh: 7300
Unit Type and Rh: 7300
Unit Type and Rh: 7300

## 2023-11-18 LAB — POCT I-STAT 7, (LYTES, BLD GAS, ICA,H+H)
Acid-base deficit: 2 mmol/L (ref 0.0–2.0)
Acid-base deficit: 5 mmol/L — ABNORMAL HIGH (ref 0.0–2.0)
Bicarbonate: 19.6 mmol/L — ABNORMAL LOW (ref 20.0–28.0)
Bicarbonate: 22.7 mmol/L (ref 20.0–28.0)
Calcium, Ion: 1.18 mmol/L (ref 1.15–1.40)
Calcium, Ion: 1.18 mmol/L (ref 1.15–1.40)
HCT: 22 % — ABNORMAL LOW (ref 39.0–52.0)
HCT: 24 % — ABNORMAL LOW (ref 39.0–52.0)
Hemoglobin: 7.5 g/dL — ABNORMAL LOW (ref 13.0–17.0)
Hemoglobin: 8.2 g/dL — ABNORMAL LOW (ref 13.0–17.0)
O2 Saturation: 100 %
O2 Saturation: 99 %
Patient temperature: 36.5
Patient temperature: 36.9
Potassium: 4 mmol/L (ref 3.5–5.1)
Potassium: 4.3 mmol/L (ref 3.5–5.1)
Sodium: 136 mmol/L (ref 135–145)
Sodium: 138 mmol/L (ref 135–145)
TCO2: 21 mmol/L — ABNORMAL LOW (ref 22–32)
TCO2: 24 mmol/L (ref 22–32)
pCO2 arterial: 33.1 mmHg (ref 32–48)
pCO2 arterial: 36.8 mmHg (ref 32–48)
pH, Arterial: 7.38 (ref 7.35–7.45)
pH, Arterial: 7.395 (ref 7.35–7.45)
pO2, Arterial: 166 mmHg — ABNORMAL HIGH (ref 83–108)
pO2, Arterial: 184 mmHg — ABNORMAL HIGH (ref 83–108)

## 2023-11-18 LAB — BPAM PLATELET PHERESIS
Blood Product Expiration Date: 202503152359
Blood Product Expiration Date: 202503152359
ISSUE DATE / TIME: 202503131715
ISSUE DATE / TIME: 202503131715
Unit Type and Rh: 7300
Unit Type and Rh: 7300

## 2023-11-18 LAB — PREPARE PLATELET PHERESIS
Unit division: 0
Unit division: 0

## 2023-11-18 LAB — MAGNESIUM
Magnesium: 3.1 mg/dL — ABNORMAL HIGH (ref 1.7–2.4)
Magnesium: 2.3 mg/dL (ref 1.7–2.4)
Magnesium: 2.2 mg/dL (ref 1.7–2.4)

## 2023-11-18 LAB — MRSA NEXT GEN BY PCR, NASAL: MRSA by PCR Next Gen: NOT DETECTED

## 2023-11-18 LAB — PREPARE FRESH FROZEN PLASMA

## 2023-11-18 LAB — PREPARE CRYOPRECIPITATE: Unit division: 0

## 2023-11-18 LAB — BPAM CRYOPRECIPITATE
Blood Product Expiration Date: 202503162359
ISSUE DATE / TIME: 202503131700
Unit Type and Rh: 5100

## 2023-11-18 LAB — PREPARE RBC (CROSSMATCH)

## 2023-11-18 MED ORDER — ORAL CARE MOUTH RINSE
15.0000 mL | OROMUCOSAL | Status: DC
  Administered 2023-11-18 – 2023-11-20 (×11): 15 mL via OROMUCOSAL

## 2023-11-18 MED ORDER — AMIODARONE HCL 200 MG PO TABS
100.0000 mg | ORAL_TABLET | Freq: Two times a day (BID) | ORAL | Status: DC
  Administered 2023-11-18 – 2023-11-21 (×8): 100 mg via ORAL
  Filled 2023-11-18 (×8): qty 1

## 2023-11-18 MED ORDER — SODIUM CHLORIDE 0.9% IV SOLUTION: Freq: Once | INTRAVENOUS | Status: AC

## 2023-11-18 MED ORDER — ORAL CARE MOUTH RINSE: 15.0000 mL | OROMUCOSAL | Status: DC | PRN

## 2023-11-18 MED ORDER — INSULIN ASPART 100 UNIT/ML IJ SOLN: 0.0000 [IU] | INTRAMUSCULAR | Status: DC

## 2023-11-18 NOTE — Procedures (Signed)
 Extubation Procedure Note  Patient Details:   Name: [name] DOB: 1946/02/23 MRN: [id number]   Airway Documentation:    Vent end date: 11/18/23 Vent end time: 0559   Evaluation  O2 sats: stable throughout Complications: No apparent complications Patient did tolerate procedure well. Bilateral Breath Sounds: Clear   Yes Prior to extubation, patient had a + cuff leak, 1400 VC, -32 NIF. ABG within normal limits. Patient extubated to [facility]. Patient able to speak after extubation. Patient vitals stable at this time. RT will assist as needed.   Ranae Plumber Naydeen Speirs 11/18/2023, 6:02 AM

## 2023-11-18 NOTE — Discharge Instructions (Signed)
 Discharge Instructions:  1. You may shower, please wash incisions daily with soap and water and keep dry.  If you wish to cover wounds with dressing you may do so but please keep clean and change daily.  No tub baths or swimming until incisions have completely healed.  If your incisions become red or develop any drainage please call our office at [phone]  2. No Driving until cleared by Dr. Karolee Ohs office and you are no longer using narcotic pain medications  3. Monitor your weight daily.. Please use the same scale and weigh at same time... If you gain 5-10 lbs in 48 hours with associated lower extremity swelling, please contact our office at [phone]  4. Fever of 101.5 for at least 24 hours with no source, please contact our office at [phone]  5. Activity- up as tolerated, please walk at least 3 times per day.  Avoid strenuous activity, no lifting, pushing, or pulling with your arms over 8-10 lbs for a minimum of 6 weeks  6. If any questions or concerns arise, please do not hesitate to contact our office at [phone]

## 2023-11-18 NOTE — Progress Notes (Signed)
 [address]       [place] [address]             [phone]      1 Day Post-Op  Procedure(s) (LRB): REPAIR OF ASCENDING AORTIC DISSECTION USING A 23mm KONECT RESILIA AORTIC VALVE CONDUIT AND 32X10MM X 50CM HEMASHIELD PLATINUM GRAFT. (N/A) ECHOCARDIOGRAM, TRANSESOPHAGEAL, INTRAOPERATIVE (N/A)   Total Length of Stay:  LOS: 1 day    SUBJECTIVE: Just extubated Hemodynamically ok.  Has had ongoing chest tube output overnight  Vitals:   11/18/23 0630 11/18/23 0645  BP:    Pulse: 80 80  Resp: 20 16  Temp: 98.2 F (36.8 C) 98.2 F (36.8 C)  SpO2: 99% 100%    Intake/Output      03/13 0701 03/14 0700 03/14 0701 03/15 0700   I.V. (mL/kg) 3751.1 (41.7)    Blood 1749    NG/GT 60    IV Piggyback 3098.7    Total Intake(mL/kg) 8658.8 (96.2)    Urine (mL/kg/hr) 1950    Emesis/NG output 0    Blood 1625    Chest Tube 1920    Total Output 5495    Net +3163.8             sodium chloride Stopped (11/18/23 0035)    ceFAZolin (ANCEF) IV Stopped (11/18/23 0557)   dexmedetomidine (PRECEDEX) IV infusion Stopped (11/18/23 0407)   insulin 1.8 Units/hr (11/18/23 0600)   lactated ringers 20 mL/hr at 11/18/23 0600   nitroGLYCERIN Stopped (11/18/23 0330)   phenylephrine (NEO-SYNEPHRINE) Adult infusion 20 mcg/min (11/18/23 0600)    CBC    Component Value Date/Time   WBC 14.9 (H) 11/18/2023 0445   RBC 2.78 (L) 11/18/2023 0445   HGB 7.5 (L) 11/18/2023 0554   HCT 22.0 (L) 11/18/2023 0554   PLT 105 (L) 11/18/2023 0445   MCV 88.1 11/18/2023 0445   MCH 30.2 11/18/2023 0445   MCHC 34.3 11/18/2023 0445   RDW 12.7 11/18/2023 0445   LYMPHSABS 0.6 (L) 11/18/2023 0005   MONOABS 0.4 11/18/2023 0005   EOSABS 0.0 11/18/2023 0005   BASOSABS 0.0 11/18/2023 0005   CMP     Component Value Date/Time   NA 136 11/18/2023 0554   NA 137 06/09/2023 0908   K 4.0 11/18/2023 0554   CL 108 11/18/2023 0445   CO2 22 11/18/2023 0445   GLUCOSE 142 (H) 11/18/2023 0445    BUN 17 11/18/2023 0445   BUN 15 06/09/2023 0908   CREATININE 1.13 11/18/2023 0445   CALCIUM 7.9 (L) 11/18/2023 0445   PROT 6.4 06/09/2023 0908   ALBUMIN 4.4 06/09/2023 0908   AST 18 06/09/2023 0908   ALT 11 06/09/2023 0908   ALKPHOS 77 06/09/2023 0908   BILITOT 0.7 06/09/2023 0908   GFRNONAA >60 11/18/2023 0445   GFRAA 84 06/10/2020 1558   ABG    Component Value Date/Time   PHART 7.380 11/18/2023 0554   PCO2ART 33.1 11/18/2023 0554   PO2ART 166 (H) 11/18/2023 0554   HCO3 19.6 (L) 11/18/2023 0554   TCO2 21 (L) 11/18/2023 0554   ACIDBASEDEF 5.0 (H) 11/18/2023 0554   O2SAT 99 11/18/2023 0554   CBG (last 3)  Recent Labs    11/18/23 0329 11/18/23 0444 11/18/23 0648  GLUCAP 137* 139* 149*  EXAM Lungs: Clear Card: RR no murmur Ext: Warm Neuro: alert no focal deficits   ASSESSMENT: POD #1 SP Dissection repair Hemodynamics: doing well. Will move to just VVI back up at 40bpm.  Continue to keep BP around 110 due to chest tube output Transfuse unit of PRBC, may need plts again if chest tube output continues.  CXR looks good Hold diuretic DC art line and swan    Eugenio Hoes, MD 11/18/2023

## 2023-11-18 NOTE — Discharge Summary (Signed)
 [address]       [place] [address]             [phone]    Physician Discharge Summary  Patient ID: Kristopher Flores MRN: [id number] DOB/AGE: 03/09/1946 78 y.o.  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 CTA showed acute intramural hematoma involving the aortic root and extending up to 10 cm length into the proximal aortic arch. The ascending aortic arch measures up to 6.2 x 6.3 cm in diameter at this level, previously measured up to 5.3 x 5.4 cm. Dr. Leafy Ro discussed with the patient the need for emergent aortic replacement. Of note, the patient has a known ATAA, is followed by Dr. Flora Lipps, and last CTA done in October 2024 showed a fusiform dilatation of the ascending aorta 5.2 x 5.1 cm.  Potential risks, benefits, and complications of the surgery were discussed with the patient and he agreed to proceed with surgery.  Hospital Course: Patient underwent emergent repair of  acute intramural ascending aortic hematoma in a patient with previous ascending aortic aneurysm. He was transported from the OR to [facility] ICU in stable condition but he is critically ill. He was extubated this am 03/14. He was on back up pacer (VVI at 40). He was transfused on 03/14. He had thrombocytopenia. Platelets went as low as 97,000. Thrombocytopenia resolved later in his post op course as his platelet count on 03/18 was up to 227,000. He was on Neo Synephrine drip and this was weaned as able. He was given low  dose Amiodarone for a fib prophylaxis. He was put on Nitroglycerin drip for hypertension and this was stopped on 03/15. Lopressor was titrated accordingly and he was restarted on Lisinopril 03/15. Chest tubes were removed on 03/16. He was above his pre op weight and had LE edema so he was given Lasix. He was stable for transfer from the ICU to 4E for further convalescence on 03/17. PT/OT were consulted for physical deconditioning. He started having PACs and appeared as though he might go into a fib. Oral Amiodarone was increased to 400 mg bid and he was given an IV bolus. Lopressor was also increased to 25 mg bid. He was started on oral Iron as H and H 03/18 was slightly decreased to 7.6 and 22.7. He went back into a fib with RVR the am of 03/19. An Amiodarone drip was ordered but was cancelled by surgeon (? he was in sinus rhythm) . He did receive a few IV Amiodarone boluses (last one around 6:40 pm) and Lopressor 25 mg bid. Per Dr. Leafy Ro, he was given oral Amiodarone yesterday.  Lisinopril was stopped 03/19 as SBP low 100's. Per Dr. Leafy Ro, no anticoagulation at this time. Patient has been ambulating on room air with good oxygenation. His wounds are clean, dry, healing without signs of infection. He has been tolerating a diet and has had a bowel movement. As discussed with Dr. Leafy Ro, will continue daily Lasix. BMET done 03/20 showed potassium to be up  to 5.3. He was given Lokelma and repeat potassium was sown to 4.3. As discussed earlier with Dr. Leafy Ro, he is stable for discharge.  Consults: pulmonary/intensive care  Significant Diagnostic Studies:  CLINICAL DATA:  Postop repair of ascending aortic dissection.   EXAM: CHEST - 2 VIEW   COMPARISON:  Radiographs 11/20/2023 and 11/19/2023.  CT 11/17/2023.   FINDINGS: Interval removal of the right IJ sheath and mediastinal drains. The heart size and mediastinal contours are stable status post median sternotomy and aortic valve replacement. There  is a probable minimal right apical pneumothorax. Interval enlargement of small left greater than right pleural effusions with increased probable atelectasis at the left lung base. No evidence of pulmonary edema. No acute osseous findings are evident.   IMPRESSION: 1. Probable minimal right apical pneumothorax. 2. Interval enlargement of small left greater than right pleural effusions with increased probable atelectasis at the left lung base.     Electronically Signed   By: Carey Bullocks M.D.   On: 11/21/2023 09:35  Narrative & Impression  CLINICAL DATA:  Status post aortic dissection repair.   EXAM: PORTABLE CHEST 1 VIEW   COMPARISON:  November 17, 2023.   FINDINGS: Stable cardiomediastinal silhouette. Endotracheal and nasogastric tubes are unchanged. Right internal jugular catheter is unchanged. Stable left basilar opacity is noted concerning for pneumonia or atelectasis with small associated effusion. Minimal right midlung subsegmental atelectasis is noted.   IMPRESSION: Stable support apparatus. Stable bibasilar opacities as noted above.     Electronically Signed   By: Lupita Raider M.D.   On: 11/18/2023 10:15      Treatments: surgery:  Emergency Ascending aortic root and ascending aortic replacement using a 23mm Connect Pericardial valve conduit and 32mm Hemashield graft from the graft of the connect prosthesis to the innominate artery takeoff utilizing deep hypothermic circulatory arrest and femoral cannulation by Dr. Leafy Ro on 11/17/2023.  Discharge Exam: Blood pressure 123/63, pulse 71, temperature 98.6 F (37 C), temperature source Oral, resp. rate 20, height 5\' 10"  (1.778 m), weight 79.8 kg, SpO2 94%. Cardiovascular: RRR Pulmonary: Slightly diminished bibasilar breath sounds L>R Abdomen: Soft, non tender, bowel sounds present. Extremities: Trace bilateral lower extremity edema. Wounds: Sternal and right groin wounds are clean and dry.  No erythema or  signs of infection.  Discharge Medications:  The patient has been discharged on:   1.Beta Blocker:  Yes [ x  ]                              No   [   ]                              If No, reason:  2.Ace Inhibitor/ARB: Yes [   ]                                     No  [  x  ]                                     If No, reason:Labile BP  3.Statin:   Yes [  x ]                  No  [   ]  If No, reason:  4.Ecasa:  Yes  [ x  ]                  No   [   ]                  If No, reason:  Patient had ACS upon admission:No  Plavix/P2Y12 inhibitor: Yes [   ]                                      No  [ x  ]  Allergies as of 11/24/2023   No Known Allergies      Medication List     STOP taking these medications    lisinopril 20 MG tablet Commonly known as: ZESTRIL       TAKE these medications    allopurinol 100 MG tablet Commonly known as: ZYLOPRIM Take 100 mg by mouth daily.   amiodarone 200 MG tablet Commonly known as: PACERONE Take 2 tablets (400 mg total) by mouth 2 (two) times daily for 2 days, THEN 1 tablet (200 mg total) 2 (two) times daily for 7 days, THEN 1 tablet (200 mg total) daily. Start taking on: November 24, 2023   aspirin EC 325 MG tablet Take 1 tablet (325 mg total) by mouth daily. What changed:  medication strength how much to take additional instructions   furosemide 40 MG tablet Commonly known as: LASIX Take 1 tablet (40 mg total) by mouth daily.   metoprolol tartrate 25 MG tablet Commonly known as: LOPRESSOR Take 1 tablet (25 mg total) by mouth 2 (two) times daily.   multivitamin tablet Take 1 tablet by mouth daily.   oxyCODONE 5 MG immediate release tablet Commonly known as: Oxy IR/ROXICODONE Take 1 tablet (5 mg total) by mouth every 6 (six) hours as needed for severe pain (pain score 7-10).   potassium chloride 10 MEQ tablet Commonly known as: KLOR-CON M Take 1 tablet (10 mEq total) by mouth daily. Start taking on:  November 27, 2023   rosuvastatin 40 MG tablet Commonly known as: CRESTOR Take 1 tablet (40 mg total) by mouth daily. What changed:  medication strength how much to take   traZODone 50 MG tablet Commonly known as: DESYREL Take 50 mg by mouth at bedtime as needed for sleep.   TYLENOL 500 MG tablet Generic drug: acetaminophen Take 500 mg by mouth every 8 (eight) hours as needed for mild pain (pain score 1-3) or moderate pain (pain score 4-6).               Durable Medical Equipment  (From admission, onward)           Start     Ordered   11/23/23 1150  For home use only DME Walker rolling  Once       Question Answer Comment  Walker: With 5 Inch Wheels   Patient needs a walker to treat with the following condition Imbalance      11/23/23 1149            Follow-up Information     [facility]. Go on 11/30/2023.   Specialty: Cardiothoracic Surgery Why: Appointment time is at 12:30 pm Contact information: [address] [place], [address] [place] [address] [phone]        [facility]. Go on 11/30/2023.   Why: Please  arrive by 11:30 am  in order to have PA/LAT CXR taken PRIOR to office appointment wtih TCTS PA Contact information: [address] [place] [address]        [facility]. Go on 12/27/2023.   Specialty: Cardiology Why: Appointment time is at 10:35 am Contact information: [address] [place] [address] [phone]        Ronney Asters, NP. Go on 12/07/2023.   Specialty: Cardiology Why: Appointment time is at 10:55 am Contact information: [address] [place] [address] [phone]         [facility] Follow up.   Why: (Adapt) rollowing walker arranged- to be delivered to room prior to discharge Contact information: [address] [facility] [place]  [address] [phone]                 Signed:  Ardelle Balls, PA-C 11/24/2023, 12:34 PM

## 2023-11-18 NOTE — Progress Notes (Signed)
   11/18/23 0329  Adult Ventilator Settings  Vent Type Servo i  Humidity HME  Vent Mode (S)  SIMV;PSV;PRVC  Set Rate (S)  16 bmp  FiO2 (%) (S)  50 %  Pressure Support (S)  10 cmH20  PEEP 5 cmH20  Adult Ventilator Measurements  SpO2 99 %   During rapid weaning trial, the patient had periods of apnea. Patient placed back on original settings.

## 2023-11-18 NOTE — Progress Notes (Signed)
      [address]       [place] [address]             [phone]    POD # 1 repair intramural hematoma ascending aorta  Up in chair  BP (!) 134/47   Pulse 68   Temp 98.7 F (37.1 C) (Oral)   Resp 12   Ht 5\' 10"  (1.778 m)   Wt 90 kg   SpO2 99%   BMI 28.47 kg/m  5L [place] 100% sat NTG @ 30  Intake/Output Summary (Last 24 hours) at 11/18/2023 1743 Last data filed at 11/18/2023 1600 Gross per 24 hour  Intake 5914.26 ml  Output 5730 ml  Net 184.26 ml  670 from CT since 0700  K 4.3 Hct 25 PLT 101K  Doing well POD # 1  Marcas Bowsher C. Dorris Fetch, MD [facility] [phone]

## 2023-11-18 NOTE — Consult Note (Signed)
   NAME:  Kristopher Flores, MRN:  [id number], DOB:  Feb 15, 1946, LOS: 1 ADMISSION DATE:  11/17/2023, CONSULTATION DATE:  3/13 REFERRING MD:  Leafy Ro, CHIEF COMPLAINT:  aortic diseection   History of Present Illness:  78 year old male w/ known h/o abd aortic aneurysm being followed and was stable as of Dec 2024. Presents to the ER acutely at [facility] reporting acute onset of chest pain while getting out of shower.  Initial sbp in 140s CT chest acute intramural hematoma involving aortic root that extended up to the proximal aortic arch c/w type A intramural hematoma but no aortic dissection  Additional eval in ER: Baseline cr 0.95, hgb 16 hct 47.5, Trop I neg  Seen by Dr Erlene Senters to OR for emergent aortic replacement   Pump time: 2h 26m Xclamp time: 2h 46m EBL: 1.6cc Cell Saver: 860cc Products: 2 plt, 2 ffp, 1 cryo, no pRBC Urine: 1L  Pertinent  Medical History  Known ascending aortic aneurysm (had been followed and stable for 3 years as of 12/24).  Coronary calcifications, HLD, HTN  Significant Hospital Events: Including procedures, antibiotic start and stop dates in addition to other pertinent events   3/13: aortic hematoma s/p repair in ICU post op  Interim History / Subjective:  Extubated, looks suprisingly good Fair bit of chest tube output but serosanguinous  Objective   Blood pressure (!) 121/53, pulse (!) 58, temperature 98.4 F (36.9 C), resp. rate 16, height 5\' 10"  (1.778 m), weight 90 kg, SpO2 99%. PAP: (13-30)/(6-20) 18/7 CVP:  [3 mmHg-88 mmHg] 6 mmHg CO:  [2.9 L/min-5 L/min] 5 L/min CI:  [1.4 L/min/m2-2.5 L/min/m2] 2.5 L/min/m2  Vent Mode: CPAP FiO2 (%):  [40 %-50 %] 40 % Set Rate:  [4 bmp-16 bmp] 4 bmp Vt Set:  [580 mL-5803 mL] 5803 mL PEEP:  [5 cmH20] 5 cmH20 Pressure Support:  [5 cmH20-10 cmH20] 10 cmH20 Plateau Pressure:  [13 cmH20-15 cmH20] 13 cmH20   Intake/Output Summary (Last 24 hours) at 11/18/2023 4098 Last data filed at 11/18/2023 0800 Gross per 24  hour  Intake 8695.58 ml  Output 5720 ml  Net 2975.58 ml   Filed Weights   11/17/23 0957 11/18/23 0500  Weight: 81.6 kg 90 kg    Examination: No distress Hoarse voice Serosanguinous fluid from drains Ext warm R femoral site sutured and looks good Moves to command but weak +murmur  Labs reviewed and look pretty dang good, getting a unit of blood CXR mild haziness left base  Resolved Hospital Problem list    Assessment & Plan:  Acute ascending aortic hematoma S/p emergent aortic root and ascending aortic replacement  ABLA expected Postop hypoxemia mild expected  - Encourage IS - Drain management, ppx abx, ppx amio, and transfusion strategy per primary - SBP goal 110 - Neo for MAP 65 - SSI, advance diet - Will follow while in ICU - SCDs, hold ac for now  31 min cc time Myrla Halsted MD PCCM

## 2023-11-18 NOTE — Progress Notes (Signed)
 eLink Physician-Brief Progress Note Patient Name: Kristopher Flores DOB: [birth year] MRN: [id number]   Date of Service  11/18/2023  HPI/Events of Note  Pt's CBG 133, refusing insulin.  He said he does not take insulin, he's not gonna start now.  No H/O DM  eICU Interventions  Goal CBG < 180, hold insulin for now. Day team to readdress if the goals are different      Intervention Category Intermediate Interventions: Hyperglycemia - evaluation and treatment  Devlyn Parish 11/18/2023, 8:31 PM

## 2023-11-19 ENCOUNTER — Inpatient Hospital Stay (HOSPITAL_COMMUNITY)

## 2023-11-19 DIAGNOSIS — J9601 Acute respiratory failure with hypoxia: Secondary | ICD-10-CM | POA: Diagnosis not present

## 2023-11-19 DIAGNOSIS — D62 Acute posthemorrhagic anemia: Secondary | ICD-10-CM | POA: Diagnosis not present

## 2023-11-19 LAB — GLUCOSE, CAPILLARY
Glucose-Capillary: 124 mg/dL — ABNORMAL HIGH (ref 70–99)
Glucose-Capillary: 135 mg/dL — ABNORMAL HIGH (ref 70–99)
Glucose-Capillary: 111 mg/dL — ABNORMAL HIGH (ref 70–99)
Glucose-Capillary: 97 mg/dL (ref 70–99)

## 2023-11-19 LAB — CBC
HCT: 23.5 % — ABNORMAL LOW (ref 39.0–52.0)
Hemoglobin: 7.9 g/dL — ABNORMAL LOW (ref 13.0–17.0)
MCH: 29.9 pg (ref 26.0–34.0)
MCHC: 33.6 g/dL (ref 30.0–36.0)
MCV: 89 fL (ref 80.0–100.0)
Platelets: 97 10*3/uL — ABNORMAL LOW (ref 150–400)
RBC: 2.64 MIL/uL — ABNORMAL LOW (ref 4.22–5.81)
RDW: 14.8 % (ref 11.5–15.5)
WBC: 16.5 10*3/uL — ABNORMAL HIGH (ref 4.0–10.5)
nRBC: 0 % (ref 0.0–0.2)

## 2023-11-19 LAB — BASIC METABOLIC PANEL
Anion gap: 7 (ref 5–15)
BUN: 19 mg/dL (ref 8–23)
CO2: 23 mmol/L (ref 22–32)
Calcium: 8 mg/dL — ABNORMAL LOW (ref 8.9–10.3)
Chloride: 107 mmol/L (ref 98–111)
Creatinine, Ser: 1.01 mg/dL (ref 0.61–1.24)
GFR, Estimated: >60 mL/min (ref >60)
Glucose, Bld: 129 mg/dL — ABNORMAL HIGH (ref 70–99)
Potassium: 4.4 mmol/L (ref 3.5–5.1)
Sodium: 137 mmol/L (ref 135–145)

## 2023-11-19 LAB — PROTIME-INR
INR: 1.2 (ref 0.8–1.2)
Prothrombin Time: 15.4 s — ABNORMAL HIGH (ref 11.4–15.2)

## 2023-11-19 LAB — HEMOGLOBIN A1C
Hgb A1c MFr Bld: 5 % (ref 4.8–5.6)
Mean Plasma Glucose: 96.8 mg/dL

## 2023-11-19 MED ORDER — LISINOPRIL 20 MG PO TABS
20.0000 mg | ORAL_TABLET | Freq: Every day | ORAL | Status: DC
  Administered 2023-11-19 – 2023-11-22 (×4): 20 mg via ORAL
  Filled 2023-11-19 (×4): qty 1

## 2023-11-19 MED ORDER — FUROSEMIDE 10 MG/ML IJ SOLN
40.0000 mg | Freq: Once | INTRAMUSCULAR | Status: AC
  Administered 2023-11-19: 40 mg via INTRAVENOUS
  Filled 2023-11-19: qty 4

## 2023-11-19 MED ORDER — METOPROLOL TARTRATE 25 MG PO TABS
25.0000 mg | ORAL_TABLET | Freq: Two times a day (BID) | ORAL | Status: DC
  Administered 2023-11-19 – 2023-11-24 (×11): 25 mg via ORAL
  Filled 2023-11-19 (×11): qty 1

## 2023-11-19 MED ORDER — METOPROLOL TARTRATE 25 MG/10 ML ORAL SUSPENSION
25.0000 mg | Freq: Two times a day (BID) | ORAL | Status: DC
  Filled 2023-11-19 (×8): qty 10

## 2023-11-19 MED ORDER — INSULIN ASPART 100 UNIT/ML IJ SOLN: 0.0000 [IU] | Freq: Three times a day (TID) | INTRAMUSCULAR | Status: DC

## 2023-11-19 MED ORDER — ALLOPURINOL 100 MG PO TABS
100.0000 mg | ORAL_TABLET | Freq: Every day | ORAL | Status: DC
Start: 2023-11-19 — End: 2023-11-24
  Administered 2023-11-19 – 2023-11-24 (×6): 100 mg via ORAL
  Filled 2023-11-19 (×6): qty 1

## 2023-11-19 MED ORDER — TRAZODONE HCL 50 MG PO TABS
50.0000 mg | ORAL_TABLET | Freq: Every evening | ORAL | Status: DC | PRN
  Administered 2023-11-21 – 2023-11-23 (×3): 50 mg via ORAL
  Filled 2023-11-19 (×3): qty 1

## 2023-11-19 MED ORDER — FUROSEMIDE 10 MG/ML IJ SOLN: 40.0000 mg | Freq: Once | INTRAMUSCULAR | Status: DC

## 2023-11-19 NOTE — Progress Notes (Signed)
   NAME:  Kristopher Flores, MRN:  [id number], DOB:  [birth year], LOS: 2 ADMISSION DATE:  11/17/2023, CONSULTATION DATE:  3/13 REFERRING MD:  Leafy Ro, CHIEF COMPLAINT:  aortic diseection   History of Present Illness:  78 year old male w/ known h/o abd aortic aneurysm being followed and was stable as of Dec 2024. Presents to the ER acutely at [facility] reporting acute onset of chest pain while getting out of shower.  Initial sbp in 140s CT chest acute intramural hematoma involving aortic root that extended up to the proximal aortic arch c/w type A intramural hematoma but no aortic dissection  Additional eval in ER: Baseline cr 0.95, hgb 16 hct 47.5, Trop I neg  Seen by Dr Erlene Senters to OR for emergent aortic replacement   Pump time: 2h 29m Xclamp time: 2h 61m EBL: 1.6cc Cell Saver: 860cc Products: 2 plt, 2 ffp, 1 cryo, no pRBC Urine: 1L  Pertinent  Medical History  Known ascending aortic aneurysm (had been followed and stable for 3 years as of 12/24).  Coronary calcifications, HLD, HTN  Significant Hospital Events: Including procedures, antibiotic start and stop dates in addition to other pertinent events   3/13: aortic hematoma s/p repair in ICU post op  Interim History / Subjective:  Continues to look good. Did dump out 300 from drains on standing  Objective   Blood pressure 127/71, pulse 88, temperature 98.6 F (37 C), temperature source Oral, resp. rate (!) 22, height 5\' 10"  (1.778 m), weight 86.2 kg, SpO2 (!) 83%. PAP: (17)/(6) 17/6 CVP:  [3 mmHg] 3 mmHg      Intake/Output Summary (Last 24 hours) at 11/19/2023 0736 Last data filed at 11/19/2023 0700 Gross per 24 hour  Intake 1124.01 ml  Output 2130 ml  Net -1005.99 ml   Filed Weights   11/17/23 0957 11/18/23 0500 11/19/23 0500  Weight: 81.6 kg 90 kg 86.2 kg    Examination: No distress Serosanguinous output in tube Sternotomy looks good R groin looks ok Moves to command Aox3 Pulling 1k on IS  CBC, Cr look  okay ?another unit vs. Watch CXR still a little wet   Resolved Hospital Problem list    Assessment & Plan:  Acute ascending aortic hematoma S/p emergent aortic root and ascending aortic replacement  ABLA expected Postop hypoxemia mild expected  Overall doing really well!  - Encourage IS - SBP 100-110 unless TCTS thinks we can liberate a bit, currently on nitroglycerin gtt - Dose of lasix - Defer transfusion and drain to TCTS - SCDs - Will follow while in ICU  30 min cc time Myrla Halsted MD PCCM

## 2023-11-19 NOTE — Progress Notes (Signed)
      [address]       [place] [address]             [phone]      POD # 2  Just back from a walk  BP (!) 119/43   Pulse 83   Temp 98.3 F (36.8 C) (Oral)   Resp 15   Ht 5\' 10"  (1.778 m)   Wt 86.2 kg   SpO2 100%   BMI 27.26 kg/m    Intake/Output Summary (Last 24 hours) at 11/19/2023 1654 Last data filed at 11/19/2023 1642 Gross per 24 hour  Intake 811.71 ml  Output 3250 ml  Net -2438.29 ml   CBG well controlled Diuresing well  Viviann Spare C. Dorris Fetch, MD [facility] [phone]

## 2023-11-19 NOTE — Progress Notes (Signed)
 2 Days Post-Op Procedure(s) (LRB): REPAIR OF ASCENDING AORTIC DISSECTION USING A 23mm KONECT RESILIA AORTIC VALVE CONDUIT AND 32X10MM X 50CM HEMASHIELD PLATINUM GRAFT. (N/A) ECHOCARDIOGRAM, TRANSESOPHAGEAL, INTRAOPERATIVE (N/A) Subjective: Some incisional pain, difficulty sleeping  Objective: Vital signs in last 24 hours: Temp:  [98.3 F (36.8 C)-99.1 F (37.3 C)] 98.6 F (37 C) (03/15 0325) Pulse Rate:  [62-88] 80 (03/15 0800) Cardiac Rhythm: Normal sinus rhythm (03/15 0800) Resp:  [0-24] 18 (03/15 0800) BP: (93-141)/(47-82) 120/75 (03/15 0800) SpO2:  [83 %-100 %] 98 % (03/15 0800) Arterial Line BP: (92-150)/(41-59) 108/48 (03/14 1600) Weight:  [86.2 kg] 86.2 kg (03/15 0500)  Hemodynamic parameters for last 24 hours:    Intake/Output from previous day: 03/14 0701 - 03/15 0700 In: 1124 [P.O.:75; I.V.:508.9; Blood:240; IV Piggyback:300.1] Out: 2130 [Urine:840; Chest Tube:1290] Intake/Output this shift: Total I/O In: 0  Out: 155 [Urine:75; Chest Tube:80]  General appearance: alert, cooperative, and no distress Neurologic: intact Heart: regular rate and rhythm Lungs: diminished breath sounds bibasilar Abdomen: normal findings: soft, non-tender  Lab Results: Recent Labs    11/18/23 1636 11/19/23 0512  WBC 18.2* 16.5*  HGB 8.4* 7.9*  HCT 24.7* 23.5*  PLT 101* 97*   BMET:  Recent Labs    11/18/23 1636 11/19/23 0512  NA 138 137  K 4.3 4.4  CL 107 107  CO2 22 23  GLUCOSE 136* 129*  BUN 16 19  CREATININE 1.12 1.01  CALCIUM 7.9* 8.0*    PT/INR:  Recent Labs    11/17/23 1846  LABPROT 19.0*  INR 1.6*   ABG    Component Value Date/Time   PHART 7.380 11/18/2023 0554   HCO3 19.6 (L) 11/18/2023 0554   TCO2 21 (L) 11/18/2023 0554   ACIDBASEDEF 5.0 (H) 11/18/2023 0554   O2SAT 99 11/18/2023 0554   CBG (last 3)  Recent Labs    11/18/23 2351 11/19/23 0323 11/19/23 0751  GLUCAP 131* 124* 135*    Assessment/Plan: S/P Procedure(s) (LRB): REPAIR OF  ASCENDING AORTIC DISSECTION USING A 23mm KONECT RESILIA AORTIC VALVE CONDUIT AND 32X10MM X 50CM HEMASHIELD PLATINUM GRAFT. (N/A) ECHOCARDIOGRAM, TRANSESOPHAGEAL, INTRAOPERATIVE (N/A) POD # 2 NEURO- intact  Was taking Trazadone for sleep at home- will reorder CV- in SR, with BP well controlled  Increase metoprolol  Resume lisinopril  Dc NTG drip  Dc pacing wires RESP- IS RENAL- creatinine normal  IV lasix again this morning ENDO- CBG mildly elevated  Change SSI to AC/HS GI- tolerating diet Anemia secondary to ABL- hgb 7.9. monitor Thrombocytopenia- PLT stable at 97K SCD + ambulation for DVT prophylaxis Chest tubes had 300 ml out when he got up to walk  Possibly dc later today   LOS: 2 days    Loreli Slot 11/19/2023

## 2023-11-19 NOTE — Plan of Care (Signed)

## 2023-11-20 ENCOUNTER — Inpatient Hospital Stay (HOSPITAL_COMMUNITY)

## 2023-11-20 LAB — CBC
HCT: 23.3 % — ABNORMAL LOW (ref 39.0–52.0)
Hemoglobin: 7.9 g/dL — ABNORMAL LOW (ref 13.0–17.0)
MCH: 30.2 pg (ref 26.0–34.0)
MCHC: 33.9 g/dL (ref 30.0–36.0)
MCV: 88.9 fL (ref 80.0–100.0)
Platelets: 117 10*3/uL — ABNORMAL LOW (ref 150–400)
RBC: 2.62 MIL/uL — ABNORMAL LOW (ref 4.22–5.81)
RDW: 14.4 % (ref 11.5–15.5)
WBC: 15.4 10*3/uL — ABNORMAL HIGH (ref 4.0–10.5)
nRBC: 0 % (ref 0.0–0.2)

## 2023-11-20 LAB — GLUCOSE, CAPILLARY
Glucose-Capillary: 139 mg/dL — ABNORMAL HIGH (ref 70–99)
Glucose-Capillary: 82 mg/dL (ref 70–99)
Glucose-Capillary: 99 mg/dL (ref 70–99)
Glucose-Capillary: 90 mg/dL (ref 70–99)
Glucose-Capillary: 98 mg/dL (ref 70–99)
Glucose-Capillary: 88 mg/dL (ref 70–99)

## 2023-11-20 LAB — BASIC METABOLIC PANEL
Anion gap: 8 (ref 5–15)
BUN: 17 mg/dL (ref 8–23)
CO2: 25 mmol/L (ref 22–32)
Calcium: 8.2 mg/dL — ABNORMAL LOW (ref 8.9–10.3)
Chloride: 104 mmol/L (ref 98–111)
Creatinine, Ser: 1.01 mg/dL (ref 0.61–1.24)
GFR, Estimated: >60 mL/min (ref >60)
Glucose, Bld: 105 mg/dL — ABNORMAL HIGH (ref 70–99)
Potassium: 4.1 mmol/L (ref 3.5–5.1)
Sodium: 137 mmol/L (ref 135–145)

## 2023-11-20 LAB — MAGNESIUM: Magnesium: 2 mg/dL (ref 1.7–2.4)

## 2023-11-20 MED ORDER — SODIUM CHLORIDE 0.9% FLUSH: 3.0000 mL | INTRAVENOUS | Status: DC | PRN

## 2023-11-20 MED ORDER — MAGNESIUM HYDROXIDE 400 MG/5ML PO SUSP: 30.0000 mL | Freq: Every day | ORAL | Status: DC | PRN

## 2023-11-20 MED ORDER — SODIUM CHLORIDE 0.9% FLUSH
3.0000 mL | Freq: Two times a day (BID) | INTRAVENOUS | Status: DC
  Administered 2023-11-20 – 2023-11-24 (×8): 3 mL via INTRAVENOUS

## 2023-11-20 MED ORDER — ALUM & MAG HYDROXIDE-SIMETH 200-200-20 MG/5ML PO SUSP: 15.0000 mL | Freq: Four times a day (QID) | ORAL | Status: DC | PRN

## 2023-11-20 MED ORDER — ~~LOC~~ CARDIAC SURGERY, PATIENT & FAMILY EDUCATION: Freq: Once | Status: AC

## 2023-11-20 MED ORDER — SODIUM CHLORIDE 0.9 % IV SOLN: 250.0000 mL | INTRAVENOUS | Status: AC | PRN

## 2023-11-20 NOTE — Progress Notes (Signed)
 3 Days Post-Op Procedure(s) (LRB): REPAIR OF ASCENDING AORTIC DISSECTION USING A 23mm KONECT RESILIA AORTIC VALVE CONDUIT AND 32X10MM X 50CM HEMASHIELD PLATINUM GRAFT. (N/A) ECHOCARDIOGRAM, TRANSESOPHAGEAL, INTRAOPERATIVE (N/A) Subjective: No complaints, walked already this AM  Objective: Vital signs in last 24 hours: Temp:  [97.8 F (36.6 C)-98.5 F (36.9 C)] 97.8 F (36.6 C) (03/16 0827) Pulse Rate:  [60-98] 70 (03/16 0800) Cardiac Rhythm: Normal sinus rhythm (03/16 0800) Resp:  [13-27] 13 (03/16 0800) BP: (91-151)/(40-84) 126/57 (03/16 0800) SpO2:  [92 %-100 %] 93 % (03/16 0800) Weight:  [83.1 kg] 83.1 kg (03/16 0500)  Hemodynamic parameters for last 24 hours:    Intake/Output from previous day: 03/15 0701 - 03/16 0700 In: 606.4 [P.O.:480; I.V.:26.4; IV Piggyback:100] Out: 2900 [Urine:2390; Chest Tube:510] Intake/Output this shift: Total I/O In: 0  Out: 45 [Urine:35; Chest Tube:10]  General appearance: alert, cooperative, and no distress Neurologic: intact Heart: regular rate and rhythm Lungs: diminished breath sounds bibasilar Wound: clean and dry  Lab Results: Recent Labs    11/19/23 0512 11/20/23 0450  WBC 16.5* 15.4*  HGB 7.9* 7.9*  HCT 23.5* 23.3*  PLT 97* 117*   BMET:  Recent Labs    11/19/23 0512 11/20/23 0450  NA 137 137  K 4.4 4.1  CL 107 104  CO2 23 25  GLUCOSE 129* 105*  BUN 19 17  CREATININE 1.01 1.01  CALCIUM 8.0* 8.2*    PT/INR:  Recent Labs    11/19/23 2127  LABPROT 15.4*  INR 1.2   ABG    Component Value Date/Time   PHART 7.380 11/18/2023 0554   HCO3 19.6 (L) 11/18/2023 0554   TCO2 21 (L) 11/18/2023 0554   ACIDBASEDEF 5.0 (H) 11/18/2023 0554   O2SAT 99 11/18/2023 0554   CBG (last 3)  Recent Labs    11/19/23 1138 11/19/23 1628 11/20/23 0737  GLUCAP 111* 97 139*    Assessment/Plan: S/P Procedure(s) (LRB): REPAIR OF ASCENDING AORTIC DISSECTION USING A 23mm KONECT RESILIA AORTIC VALVE CONDUIT AND 32X10MM X 50CM  HEMASHIELD PLATINUM GRAFT. (N/A) ECHOCARDIOGRAM, TRANSESOPHAGEAL, INTRAOPERATIVE (N/A) POD # 3 NEURO- intact CV- in Sr, BP well controlled on current regimen RESP- IS RENAL- diuresed well yesterday creatinine normal, lytes OK ENDO- CBG well controlled GI- tolerating diet Anemia- hgb stabel at 7.9 Continue cardiac rehab Dc chest tubes Transfer to 4E   LOS: 3 days    Loreli Slot 11/20/2023

## 2023-11-20 NOTE — Plan of Care (Signed)

## 2023-11-20 NOTE — Progress Notes (Signed)
      [address]       [place] [address]             [phone]      POD # 3  Sleeping  BP 139/65   Pulse 73   Temp 98.5 F (36.9 C) (Oral)   Resp 19   Ht 5\' 10"  (1.778 m)   Wt 83.1 kg   SpO2 92%   BMI 26.30 kg/m   RA 92% sat   Intake/Output Summary (Last 24 hours) at 11/20/2023 1754 Last data filed at 11/20/2023 1700 Gross per 24 hour  Intake 600 ml  Output 1110 ml  Net -510 ml   Doing well Awaiting bed on tele unit  Viviann Spare C. Dorris Fetch, MD [facility] [phone]

## 2023-11-20 NOTE — Progress Notes (Signed)
 11/20/2023 Case discussed with RN; labs/imaging reviewed. Doing very well.  Defer management to TCTS.  Will see again formally tomorrow if still here.  Myrla Halsted MD PCCM

## 2023-11-21 ENCOUNTER — Inpatient Hospital Stay (HOSPITAL_COMMUNITY)

## 2023-11-21 LAB — TYPE AND SCREEN
ABO/RH(D): B NEG
Antibody Screen: NEGATIVE
Unit division: 0
Unit division: 0
Unit division: 0
Unit division: 0
Unit division: 0
Unit division: 0
Unit division: 0
Unit division: 0

## 2023-11-21 LAB — GLUCOSE, CAPILLARY
Glucose-Capillary: 88 mg/dL (ref 70–99)
Glucose-Capillary: 92 mg/dL (ref 70–99)
Glucose-Capillary: 92 mg/dL (ref 70–99)

## 2023-11-21 LAB — BPAM RBC
Blood Product Expiration Date: 202503312359
Blood Product Expiration Date: 202503312359
Blood Product Expiration Date: 202504012359
Blood Product Expiration Date: 202504042359
Blood Product Expiration Date: 202504122359
Blood Product Expiration Date: 202504122359
Blood Product Expiration Date: 202504122359
Blood Product Expiration Date: 202504122359
ISSUE DATE / TIME: 202503131316
ISSUE DATE / TIME: 202503131322
ISSUE DATE / TIME: 202503131352
ISSUE DATE / TIME: 202503131352
ISSUE DATE / TIME: 202503131352
ISSUE DATE / TIME: 202503131827
ISSUE DATE / TIME: 202503141244
ISSUE DATE / TIME: 202503141654
Unit Type and Rh: 1700
Unit Type and Rh: 1700
Unit Type and Rh: 1700
Unit Type and Rh: 1700
Unit Type and Rh: 5100
Unit Type and Rh: 5100
Unit Type and Rh: 5100
Unit Type and Rh: 5100

## 2023-11-21 LAB — SURGICAL PATHOLOGY

## 2023-11-21 LAB — MAGNESIUM: Magnesium: 1.9 mg/dL (ref 1.7–2.4)

## 2023-11-21 MED ORDER — ENSURE ENLIVE PO LIQD
237.0000 mL | Freq: Three times a day (TID) | ORAL | Status: DC
  Administered 2023-11-21 – 2023-11-24 (×7): 237 mL via ORAL

## 2023-11-21 MED ORDER — FUROSEMIDE 40 MG PO TABS
40.0000 mg | ORAL_TABLET | Freq: Every day | ORAL | Status: DC
  Administered 2023-11-21 – 2023-11-24 (×4): 40 mg via ORAL
  Filled 2023-11-21 (×4): qty 1

## 2023-11-21 MED ORDER — POTASSIUM CHLORIDE CRYS ER 20 MEQ PO TBCR
20.0000 meq | EXTENDED_RELEASE_TABLET | Freq: Every day | ORAL | Status: DC
  Administered 2023-11-21: 20 meq via ORAL
  Filled 2023-11-21: qty 1

## 2023-11-21 MED ORDER — LACTULOSE 10 GM/15ML PO SOLN
20.0000 g | Freq: Once | ORAL | Status: AC
  Administered 2023-11-21: 20 g via ORAL
  Filled 2023-11-21: qty 30

## 2023-11-21 MED FILL — Mannitol IV Soln 20%: INTRAVENOUS | Qty: 500 | Status: AC

## 2023-11-21 MED FILL — Lidocaine HCl Local Preservative Free (PF) Inj 2%: INTRAMUSCULAR | Qty: 14 | Status: AC

## 2023-11-21 MED FILL — Potassium Chloride Inj 2 mEq/ML: INTRAVENOUS | Qty: 40 | Status: AC

## 2023-11-21 MED FILL — Electrolyte-R (PH 7.4) Solution: INTRAVENOUS | Qty: 4000 | Status: AC

## 2023-11-21 MED FILL — Heparin Sodium (Porcine) Inj 1000 Unit/ML: Qty: 1000 | Status: AC

## 2023-11-21 MED FILL — Sodium Chloride IV Soln 0.9%: INTRAVENOUS | Qty: 2000 | Status: AC

## 2023-11-21 MED FILL — Heparin Sodium (Porcine) Inj 1000 Unit/ML: INTRAMUSCULAR | Qty: 20 | Status: AC

## 2023-11-21 MED FILL — Sodium Bicarbonate IV Soln 8.4%: INTRAVENOUS | Qty: 150 | Status: AC

## 2023-11-21 NOTE — Progress Notes (Unsigned)
 [address]       [place] [address]             [phone]      HPI: Mr. 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 The patient returns for routine postoperative follow-up having undergone emergency ascending aortic root and ascending aortic replacement using a 23mm Connect Pericardial valve conduit and 32mm Hemashield graft by Dr. Leafy Ro on 11/17/23. The patient's early postoperative recovery while in the hospital was notable for thrombocytopenia that resolved with time and atrial fibrillation with RVR that resolved with IV Amiodarone. He was discharged in stable condition on 11/24/23. Since hospital discharge the patient reports low energy, shortness of breath with exertion remaining the same since hospital discharge, and slight chest soreness requiring Oxycodone every few nights. He denies dizziness, falls and LOC. He also denies leg swelling and cough. He does state he felt like he had an elevated heart rate yesterday while lying down but it resolved quickly, he otherwise denies palpitations. He is walking about 20 times per day for short periods and he is using the walker less. He is currently living by himself since his partner has dementia and is staying with family but his family has been checking in. Has not showered yet, only sponge bathed but he does have a shower chair at home.    No current facility-administered medications for this visit.   No current outpatient medications on file.   Facility-Administered Medications Ordered in Other Visits  Medication Dose Route Frequency Provider Last Rate Last Admin   acetaminophen (TYLENOL) tablet 1,000 mg  1,000 mg Oral Q6H Loreli Slot, MD   1,000 mg at 11/20/23 1136   Or   acetaminophen (TYLENOL) 160 MG/5ML solution 1,000 mg  1,000 mg Per Tube Q6H Loreli Slot, MD    1,000 mg at 11/18/23 0513   allopurinol (ZYLOPRIM) tablet 100 mg  100 mg Oral Daily Loreli Slot, MD   100 mg at 11/21/23 0824   alum & mag hydroxide-simeth (MAALOX/MYLANTA) 200-200-20 MG/5ML suspension 15 mL  15 mL Oral Q6H PRN Loreli Slot, MD       amiodarone (PACERONE) tablet 100 mg  100 mg Oral BID Loreli Slot, MD   100 mg at 11/21/23 2841   aspirin EC tablet 325 mg  325 mg Oral Daily Loreli Slot, MD   325 mg at 11/21/23 3244   Or   aspirin chewable tablet 324 mg  324 mg Per Tube Daily Loreli Slot, MD   324 mg at 11/20/23 0102   bisacodyl (DULCOLAX) EC tablet 10 mg  10 mg Oral Daily Loreli Slot, MD   10 mg at 11/21/23 7253   Or   bisacodyl (DULCOLAX) suppository 10 mg  10 mg Rectal Daily Loreli Slot, MD       Chlorhexidine Gluconate Cloth 2 % PADS 6 each  6 each Topical Daily Loreli Slot, MD   6 each at 11/21/23 0825   docusate sodium (COLACE) capsule 200 mg  200 mg Oral Daily Loreli Slot, MD   200 mg at 11/21/23 0824   feeding supplement (ENSURE ENLIVE / ENSURE PLUS) liquid 237 mL  237 mL Oral TID WC Doree Fudge M, PA-C   237 mL at 11/21/23 1236   furosemide (LASIX) tablet 40 mg  40 mg  Oral Daily Doree Fudge M, PA-C   40 mg at 11/21/23 1610   lisinopril (ZESTRIL) tablet 20 mg  20 mg Oral Daily Loreli Slot, MD   20 mg at 11/21/23 9604   magnesium hydroxide (MILK OF MAGNESIA) suspension 30 mL  30 mL Oral Daily PRN Loreli Slot, MD       metoprolol tartrate (LOPRESSOR) tablet 25 mg  25 mg Oral BID Loreli Slot, MD   25 mg at 11/21/23 5409   Or   metoprolol tartrate (LOPRESSOR) 25 mg/10 mL oral suspension 25 mg  25 mg Per Tube BID Loreli Slot, MD       metoprolol tartrate (LOPRESSOR) injection 2.5-5 mg  2.5-5 mg Intravenous Q2H PRN Loreli Slot, MD   5 mg at 11/20/23 2207   ondansetron (ZOFRAN) injection 4 mg  4 mg Intravenous Q6H PRN  Loreli Slot, MD       Oral care mouth rinse  15 mL Mouth Rinse PRN Loreli Slot, MD       oxyCODONE (Oxy IR/ROXICODONE) immediate release tablet 5-10 mg  5-10 mg Oral Q3H PRN Loreli Slot, MD   5 mg at 11/20/23 2337   pantoprazole (PROTONIX) EC tablet 40 mg  40 mg Oral Daily Loreli Slot, MD   40 mg at 11/21/23 0824   potassium chloride SA (KLOR-CON M) CR tablet 20 mEq  20 mEq Oral Daily Doree Fudge M, PA-C   20 mEq at 11/21/23 8119   rosuvastatin (CRESTOR) tablet 40 mg  40 mg Oral Daily Loreli Slot, MD   40 mg at 11/21/23 0824   sodium chloride flush (NS) 0.9 % injection 3 mL  3 mL Intravenous Q12H Loreli Slot, MD   3 mL at 11/21/23 0826   sodium chloride flush (NS) 0.9 % injection 3 mL  3 mL Intravenous PRN Loreli Slot, MD       traZODone (DESYREL) tablet 50 mg  50 mg Oral QHS PRN Loreli Slot, MD       Vitals: Today's Vitals   11/30/23 1216  BP: (!) 113/51  Pulse: 63  Resp: 18  SpO2: 97%  Weight: 178 lb (80.7 kg)  Height: 5\' 10"  (1.778 m)   Body mass index is 25.54 kg/m.  Physical Exam: General: Alert and oriented, no acute distress Neuro: Grossly intact CV: Regular rate and rhythm, no murmur Pulm: Slightly diminished bibasilar breath sounds GI: +BS, no tenderness or distension Extremities: No edema BLE Wound: Sternal and groin incisions are clean and dry without sign of infection  Diagnostic Tests: CLINICAL DATA:  Shortness of breath. Status post aortic valve repair.   EXAM: CHEST - 2 VIEW   COMPARISON:  November 21, 2023.   FINDINGS: Stable cardiomediastinal silhouette. Sternotomy wires are noted. Small bilateral pleural effusions are noted, left greater than right. Minimal left basilar atelectasis is noted.   IMPRESSION: Small bilateral pleural effusions with minimal left basilar atelectasis.     Electronically Signed   By: Lupita Raider M.D.   On: 11/30/2023  12:09  Impression: S/P [place]: The patient is overall progressing very well from surgery. He has some sternal soreness but this is improving with time and he has only used Oxycodone a few nights for pain. I also recommended Tylenol if preferred for pain. The patient has not showered and only sponge bathed, I cleared the patient to shower with soap and water and pat his incisions dry. He is saturating  well on room air and ambulating with his walker about 20 times per day. He does admit to shortness of breath with exertion and can only walk short distances. He has small bilateral pleural effusions on CXR and left basilar atelectasis that is improving. The patient does admit he had oxygen delivered to his home since it was $10.00 but never used it. I will give him 1 more week of Lasix and potassium for bilateral pleural effusions and shortness of breath. I encouraged incentive spirometer as well, shortness of breath should improve with time. He admits that his appetite is improving. He had postoperative atrial fibrillation with RVR and remains on PO Amiodarone daily. He noted one episode of what he felt to be an elevated heart rate while lying down but denies shortness of breath, dizziness, nausea and vomiting. He does not have a monitor at home, it was recommended he get something to monitor his heart rate such as a pulse oximeter. Today he seems to be in NSR but there is no EKG in the office. Continue Amiodarone for at least 1 month. His incisions are healing well without sign of infection. We discussed continued sternal precautions and endocarditis prophylaxis.   Plan: Continue Amiodarone and recommended heart rate monitor. I will give 7 days of PO Lasix 40mg  daily and Potassium for shortness of breath and bilateral pleural effusions. Plan to have the patient return to the clinic in 2-3 weeks with a CXR with Dr. Leafy Ro for driving clearance and cardiac rehab clearance. Follow up with cardiology on 04/02  and echocardiogram is 04/22.    Jenny Reichmann, PA-C [facility] [phone]

## 2023-11-21 NOTE — Progress Notes (Cosign Needed Addendum)
      [address]       [place] [address]             [phone]        4 Days Post-Op Procedure(s) (LRB): REPAIR OF ASCENDING AORTIC DISSECTION USING A 23mm KONECT RESILIA AORTIC VALVE CONDUIT AND 32X10MM X 50CM HEMASHIELD PLATINUM GRAFT. (N/A) ECHOCARDIOGRAM, TRANSESOPHAGEAL, INTRAOPERATIVE (N/A)  Subjective: Patient has not had bowel movement yet  Objective: Vital signs in last 24 hours: Temp:  [97.8 F (36.6 C)-98.5 F (36.9 C)] 98 F (36.7 C) (03/17 0542) Pulse Rate:  [58-90] 76 (03/17 0542) Cardiac Rhythm: Normal sinus rhythm;Bundle branch block (03/16 2244) Resp:  [4-27] 16 (03/17 0542) BP: (108-157)/(57-86) 136/71 (03/17 0542) SpO2:  [87 %-100 %] 93 % (03/17 0542) Weight:  [83 kg] 83 kg (03/17 0500)  Pre op weight 81.6 kg Current Weight  11/21/23 83 kg      Intake/Output from previous day: 03/16 0701 - 03/17 0700 In: 700 [P.O.:700] Out: 830 [Urine:740; Chest Tube:90]   Physical Exam:  Cardiovascular: RRR Pulmonary: Slightly diminished bibasilar breath sounds L>R Abdomen: Soft, non tender, bowel sounds present. Extremities: Mild bilateral lower extremity edema. Wounds: Clean and dry.  No erythema or signs of infection.  Lab Results: CBC: Recent Labs    11/19/23 0512 11/20/23 0450  WBC 16.5* 15.4*  HGB 7.9* 7.9*  HCT 23.5* 23.3*  PLT 97* 117*   BMET:  Recent Labs    11/19/23 0512 11/20/23 0450  NA 137 137  K 4.4 4.1  CL 107 104  CO2 23 25  GLUCOSE 129* 105*  BUN 19 17  CREATININE 1.01 1.01  CALCIUM 8.0* 8.2*    PT/INR:  Lab Results  Component Value Date   INR 1.2 11/19/2023   INR 1.6 (H) 11/17/2023   ABG:  INR: Will add last result for INR, ABG once components are confirmed Will add last 4 CBG results once components are confirmed  Assessment/Plan:  1. CV - SR. On prophylactic Amiodarone 100 mg bid, Lopressor 25 mg bid, and Lisinopril 20 mg daily 2.  Pulmonary - On 2 liters of oxygen via [place];wean as  able. CXR this am appears to show left base atelectasis, small effusion. Encourage incentive spirometer and flutter valve. 3. Above pre op weight, requires further diuresis-will give Lasix 40 mg daily 4.  Expected post op acute blood loss anemia - Last H and H 7.9 and 23.3 5. CBGs 90/88/88. Pre op HGA1C 5. Will stop accu checks and SS PRN. 6. Mild thrombocytopenia-last platelets up to 117,000 7. LOC constipation 8. Deconditioned-will consult PT/OT. Patient has someone to help him at home when ready for discharge.  Nakeita Styles M ZimmermanPA-C 6:59 AM

## 2023-11-21 NOTE — Plan of Care (Signed)
  Problem: Health Behavior/Discharge Planning: Goal: Ability to manage health-related needs will improve Outcome: Progressing   Problem: Clinical Measurements: Goal: Ability to maintain clinical measurements within normal limits will improve Outcome: Progressing Goal: Will remain free from infection Outcome: Progressing Goal: Cardiovascular complication will be avoided Outcome: Progressing   Problem: Activity: Goal: Risk for activity intolerance will decrease Outcome: Progressing   Problem: Pain Managment: Goal: General experience of comfort will improve and/or be controlled Outcome: Progressing   Problem: Safety: Goal: Ability to remain free from injury will improve Outcome: Progressing

## 2023-11-21 NOTE — Evaluation (Signed)
 Occupational Therapy Evaluation Patient Details Name: Kristopher Flores MRN: [id number] DOB: [birth year] Today's Date: 11/21/2023   History of Present Illness   Patient is a 78 yo male presenting to the ED with L sided chest pain on 11/17/23. 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 Aortic replacement completed on 3/13. Extubated 3/14. PMH includes: ascending aortic repair, HTN     Clinical Impressions Prior to this admission, patient living with his partner, and independent in all ADLs and functional mobility. Patient still works and drives. Currently, patient presenting with minimal lethargy, minimal generalized weakness, and minimal cues to recall sternal precuations. Handout on sternal precautions provided at end of session. Patient min A for bed mobility, min A for ADL management, and mod I for transfers with heart pillow and taking a few steps to [facility]. OT will continue to follow acutely, but anticipate no need for further OT needed at discharge.      If plan is discharge home, recommend the following:   A little help with walking and/or transfers;A little help with bathing/dressing/bathroom;Assistance with cooking/housework;Assist for transportation;Help with stairs or ramp for entrance (initially)     Functional Status Assessment   Patient has had a recent decline in their functional status and demonstrates the ability to make significant improvements in function in a reasonable and predictable amount of time.     Equipment Recommendations   None recommended by OT     Recommendations for Other Services         Precautions/Restrictions   Precautions Precautions: Sternal Precaution Booklet Issued: Yes (comment) Recall of Precautions/Restrictions: Intact Restrictions Weight Bearing Restrictions Per Provider Order: Yes RUE Weight Bearing Per Provider Order: Non weight bearing LUE Weight Bearing Per Provider Order: Non weight bearing     Mobility  Bed Mobility Overal bed mobility: Needs Assistance Bed Mobility: Sit to Sidelying, Sidelying to Sit, Rolling Rolling: Contact guard assist Sidelying to sit: Min assist     Sit to sidelying: Contact guard assist General bed mobility comments: Min A to come up to sitting, cues to not use hands to push up    Transfers Overall transfer level: Modified independent Equipment used: None               General transfer comment: using heart pillow and rocking to come up into standing, able to take a few steps towards [facility] without effort      Balance Overall balance assessment: Mild deficits observed, not formally tested                                         ADL either performed or assessed with clinical judgement   ADL Overall ADL's : Needs assistance/impaired Eating/Feeding: Set up;Sitting   Grooming: Oral care;Wash/dry face;Wash/dry hands;Set up;Sitting   Upper Body Bathing: Set up;Sitting   Lower Body Bathing: Contact guard assist;Sitting/lateral leans;Sit to/from stand   Upper Body Dressing : Set up;Sitting   Lower Body Dressing: Minimal assistance;Sit to/from stand;Sitting/lateral leans   Toilet Transfer: Modified Community education officer Details (indicate cue type and reason): able to come into standing with heart pillow and take steps to [facility] without difficulty Toileting- Clothing Manipulation and Hygiene: Contact guard assist;Sit to/from stand;Sitting/lateral lean       Functional mobility during ADLs: Minimal assistance;Cueing for sequencing;Cueing for safety General ADL Comments: Prior to this admission, patient living with his partner, and independent in all  ADLs and functional mobility. Patient still works and drives. Currently, patient presenting with minimal lethargy, minimal generalized weakness, and minimal cues to recall sternal precuations. Handout on sternal precautions provided at end of session. Patient min A for bed mobility, min A  for ADL management, and mod I for transfers with heart pillow and taking a few steps to [facility]. OT will continue to follow acutely, but anticipate no need for further OT needed at discharge.     Vision Baseline Vision/History: 1 Wears glasses Ability to See in Adequate Light: 0 Adequate Patient Visual Report: No change from baseline Vision Assessment?: No apparent visual deficits     Perception Perception: Not tested       Praxis Praxis: Not tested       Pertinent Vitals/Pain Pain Assessment Pain Assessment: 0-10 Pain Score: 3  Pain Location: chest pain Pain Descriptors / Indicators: Grimacing, Guarding Pain Intervention(s): Limited activity within patient's tolerance, Monitored during session, Repositioned     Extremity/Trunk Assessment Upper Extremity Assessment Upper Extremity Assessment: Overall WFL for tasks assessed (did not assess in full detail due to sternal precautions)   Lower Extremity Assessment Lower Extremity Assessment: Defer to PT evaluation   Cervical / Trunk Assessment Cervical / Trunk Assessment: Normal   Communication Communication Communication: No apparent difficulties   Cognition Arousal: Lethargic Behavior During Therapy: WFL for tasks assessed/performed Cognition: No apparent impairments             OT - Cognition Comments: Dosing off, but knowledgeable of sternal precautions and oriented                 Following commands: Intact       Cueing  General Comments   Cueing Techniques: Verbal cues  VSS on RA   Exercises     Shoulder Instructions      Home Living Family/patient expects to be discharged to:: Private residence Living Arrangements: Spouse/significant other Available Help at Discharge: Available 24 hours/day Type of Home: House (Duplex) Home Access: Stairs to enter Entrance Stairs-Number of Steps: 2 Entrance Stairs-Rails: Can reach both Home Layout: One level     Bathroom Shower/Tub: Scientist, research (life sciences): Standard     Home Equipment: None          Prior Functioning/Environment Prior Level of Function : Independent/Modified Independent;Driving;Working/employed             Mobility Comments: independent ADLs Comments: independent, likes to help people and golf    OT Problem List: Decreased strength;Decreased activity tolerance;Decreased knowledge of precautions;Impaired UE functional use   OT Treatment/Interventions: Self-care/ADL training;Therapeutic exercise;Energy conservation;DME and/or AE instruction;Therapeutic activities;Patient/family education;Balance training      OT Goals(Current goals can be found in the care plan section)   Acute Rehab OT Goals Patient Stated Goal: to get better and stronger OT Goal Formulation: With patient Time For Goal Achievement: 12/05/23 Potential to Achieve Goals: Good ADL Goals Pt Will Perform Lower Body Bathing: with modified independence;sitting/lateral leans;sit to/from stand Pt Will Perform Lower Body Dressing: with modified independence;sit to/from stand;sitting/lateral leans Pt Will Transfer to Toilet: with modified independence;ambulating Pt Will Perform Toileting - Clothing Manipulation and hygiene: with modified independence;sitting/lateral leans;sit to/from stand Additional ADL Goal #1: Patient will be knowledgeable in sternal precautions without need for cues in order to return home safely.   OT Frequency:  Min 2X/week    Co-evaluation              AM-PAC OT "6 Clicks" Daily Activity  Outcome Measure Help from another person eating meals?: A Little Help from another person taking care of personal grooming?: A Little Help from another person toileting, which includes using toliet, bedpan, or urinal?: A Little Help from another person bathing (including washing, rinsing, drying)?: A Little Help from another person to put on and taking off regular upper body clothing?: A Little Help from another  person to put on and taking off regular lower body clothing?: A Little 6 Click Score: 18   End of Session Nurse Communication: Mobility status  Activity Tolerance: Patient tolerated treatment well Patient left: in bed;with call bell/phone within reach  OT Visit Diagnosis: Unsteadiness on feet (R26.81);Other abnormalities of gait and mobility (R26.89);Pain                Time: 5284-1324 OT Time Calculation (min): 21 min Charges:  OT General Charges $OT Visit: 1 Visit OT Evaluation $OT Eval Moderate Complexity: 1 Mod  Pollyann Glen E. Darrion Macaulay, OTR/L Acute Rehabilitation Services [phone]   [name] 11/21/2023, 12:50 PM

## 2023-11-21 NOTE — Evaluation (Signed)
 Physical Therapy Evaluation Patient Details Name: Kristopher Flores MRN: [id number] DOB: 10/21/1945 Today's Date: 11/21/2023  History of Present Illness  Patient is a 78 yo male presenting to the ED with L sided chest pain on 11/17/23. 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 Aortic replacement completed on 3/13. Extubated 3/14. PMH includes: ascending aortic repair, HTN  Clinical Impression   Pt admitted secondary to problem above with deficits below. PTA patient was independent and working.  Pt currently requires CGA-min assist for basic mobility and ambulation with RW. Anticipate he will progress to ambulation without a device with no PT needs on discharge.  Anticipate patient will benefit from PT to address problems listed below.Will continue to follow acutely to maximize functional mobility independence and safety.           If plan is discharge home, recommend the following:     Can travel by private vehicle        Equipment Recommendations None recommended by PT  Recommendations for Other Services       Functional Status Assessment Patient has had a recent decline in their functional status and demonstrates the ability to make significant improvements in function in a reasonable and predictable amount of time.     Precautions / Restrictions Precautions Precautions: Sternal Precaution Booklet Issued: Yes (comment) Recall of Precautions/Restrictions: Impaired Precaution/Restrictions Comments: required cues to adhere to precautions      Mobility  Bed Mobility Overal bed mobility: Needs Assistance Bed Mobility: Sit to Sidelying, Sidelying to Sit, Rolling Rolling: Contact guard assist Sidelying to sit: Min assist     Sit to sidelying: Contact guard assist General bed mobility comments: Min A to come up to sitting, cues to not use hands to push up    Transfers Overall transfer level: Modified independent Equipment used: None                General transfer comment: hands on knees    Ambulation/Gait Ambulation/Gait assistance: Contact guard assist Gait Distance (Feet): 200 Feet Assistive device: Rolling walker (2 wheels) Gait Pattern/deviations: Step-through pattern   Gait velocity interpretation: 1.31 - 2.62 ft/sec, indicative of limited community ambulator   General Gait Details: vc for proximity to RW and upright posture; very light use of UEs on RW  Stairs            Wheelchair Mobility     Tilt Bed    Modified Rankin (Stroke Patients Only)       Balance Overall balance assessment: No apparent balance deficits (not formally assessed)                                           Pertinent Vitals/Pain Pain Assessment Pain Assessment: 0-10 Pain Score: 3  Pain Location: chest pain Pain Descriptors / Indicators: Guarding, Discomfort Pain Intervention(s): Limited activity within patient's tolerance, Monitored during session, Repositioned    Home Living Family/patient expects to be discharged to:: Private residence Living Arrangements: Spouse/significant other Available Help at Discharge: Available 24 hours/day Type of Home: House (Duplex) Home Access: Stairs to enter Entrance Stairs-Rails: Right;Left;Can reach both Entrance Stairs-Number of Steps: 4   Home Layout: One level Home Equipment: None      Prior Function Prior Level of Function : Independent/Modified Independent;Driving;Working/employed             Mobility Comments: independent ADLs Comments: independent, likes to  help people and golf     Extremity/Trunk Assessment   Upper Extremity Assessment Upper Extremity Assessment: Defer to OT evaluation    Lower Extremity Assessment Lower Extremity Assessment: Overall WFL for tasks assessed    Cervical / Trunk Assessment Cervical / Trunk Assessment: Normal  Communication   Communication Communication: No apparent difficulties    Cognition Arousal:  Alert Behavior During Therapy: WFL for tasks assessed/performed   PT - Cognitive impairments: No apparent impairments                         Following commands: Intact       Cueing Cueing Techniques: Verbal cues     General Comments General comments (skin integrity, edema, etc.): VSS on RA    Exercises     Assessment/Plan    PT Assessment Patient needs continued PT services  PT Problem List Decreased activity tolerance;Decreased mobility;Decreased knowledge of use of DME;Decreased knowledge of precautions       PT Treatment Interventions DME instruction;Gait training;Stair training;Functional mobility training;Therapeutic activities;Therapeutic exercise;Patient/family education    PT Goals (Current goals can be found in the Care Plan section)  Acute Rehab PT Goals Patient Stated Goal: to feel better PT Goal Formulation: With patient Time For Goal Achievement: 12/05/23 Potential to Achieve Goals: Good    Frequency Min 2X/week     Co-evaluation               AM-PAC PT "6 Clicks" Mobility  Outcome Measure Help needed turning from your back to your side while in a flat bed without using bedrails?: A Little Help needed moving from lying on your back to sitting on the side of a flat bed without using bedrails?: A Little Help needed moving to and from a bed to a chair (including a wheelchair)?: A Little Help needed standing up from a chair using your arms (e.g., wheelchair or bedside chair)?: None Help needed to walk in hospital room?: A Little Help needed climbing 3-5 steps with a railing? : A Little 6 Click Score: 19    End of Session Equipment Utilized During Treatment: Gait belt Activity Tolerance: Patient tolerated treatment well Patient left: in bed;with call bell/phone within reach Nurse Communication: Mobility status PT Visit Diagnosis: Difficulty in walking, not elsewhere classified (R26.2)    Time: 8295-6213 PT Time Calculation (min)  (ACUTE ONLY): 21 min   Charges:   PT Evaluation $PT Eval Low Complexity: 1 Low   PT General Charges $$ ACUTE PT VISIT: 1 Visit          Jerolyn Center, PT Acute Rehabilitation Services  Office [phone]   Zena Amos 11/21/2023, 3:59 PM

## 2023-11-21 NOTE — Progress Notes (Signed)
 PT Cancellation Note  Patient Details Name: Kristopher Flores MRN: [id number] DOB: 05-08-1946   Cancelled Treatment:    Reason Eval/Treat Not Completed: Fatigue/lethargy limiting ability to participate  Patient just finished working with OT. Falling asleep during their session. Will return later today for evaluation.   Jerolyn Center, PT Acute Rehabilitation Services  Office [phone]  Zena Amos 11/21/2023, 10:25 AM

## 2023-11-21 NOTE — Progress Notes (Signed)
 Mobility Specialist Progress Note:    11/21/23 0930  Mobility  Activity Ambulated with assistance to bathroom  Level of Assistance Minimal assist, patient does 75% or more  Assistive Device Front wheel walker  Distance Ambulated (ft) 10 ft  RUE Weight Bearing Per Provider Order NWB  LUE Weight Bearing Per Provider Order NWB  Activity Response Tolerated well  Mobility Referral Yes  Mobility visit 1 Mobility  Mobility Specialist Start Time (ACUTE ONLY) [id number]  Mobility Specialist Stop Time (ACUTE ONLY) 0930  Mobility Specialist Time Calculation (min) (ACUTE ONLY) 5 min   Pt received in bathroom, requesting assistance to return to bed. MinA required to stand from commode with RW. Tolerated well, VSS throughout. Left pt in bed with all needs met.    Feliciana Rossetti Mobility Specialist Please contact via Special educational needs teacher or  Rehab office at [phone]

## 2023-11-22 ENCOUNTER — Other Ambulatory Visit: Payer: Self-pay | Admitting: Cardiology

## 2023-11-22 DIAGNOSIS — Z9889 Other specified postprocedural states: Secondary | ICD-10-CM

## 2023-11-22 LAB — BASIC METABOLIC PANEL
Anion gap: 8 (ref 5–15)
BUN: 21 mg/dL (ref 8–23)
CO2: 27 mmol/L (ref 22–32)
Calcium: 8.2 mg/dL — ABNORMAL LOW (ref 8.9–10.3)
Chloride: 100 mmol/L (ref 98–111)
Creatinine, Ser: 1.19 mg/dL (ref 0.61–1.24)
GFR, Estimated: >60 mL/min (ref >60)
Glucose, Bld: 107 mg/dL — ABNORMAL HIGH (ref 70–99)
Potassium: 3.9 mmol/L (ref 3.5–5.1)
Sodium: 135 mmol/L (ref 135–145)

## 2023-11-22 LAB — CBC
HCT: 22.7 % — ABNORMAL LOW (ref 39.0–52.0)
Hemoglobin: 7.6 g/dL — ABNORMAL LOW (ref 13.0–17.0)
MCH: 29.6 pg (ref 26.0–34.0)
MCHC: 33.5 g/dL (ref 30.0–36.0)
MCV: 88.3 fL (ref 80.0–100.0)
Platelets: 227 10*3/uL (ref 150–400)
RBC: 2.57 MIL/uL — ABNORMAL LOW (ref 4.22–5.81)
RDW: 14 % (ref 11.5–15.5)
WBC: 13.4 10*3/uL — ABNORMAL HIGH (ref 4.0–10.5)
nRBC: 0 % (ref 0.0–0.2)

## 2023-11-22 LAB — MAGNESIUM: Magnesium: 1.9 mg/dL (ref 1.7–2.4)

## 2023-11-22 MED ORDER — POTASSIUM CHLORIDE CRYS ER 20 MEQ PO TBCR
20.0000 meq | EXTENDED_RELEASE_TABLET | Freq: Two times a day (BID) | ORAL | Status: AC
  Administered 2023-11-22 (×2): 20 meq via ORAL
  Filled 2023-11-22 (×2): qty 1

## 2023-11-22 MED ORDER — AMIODARONE IV BOLUS ONLY 150 MG/100ML
150.0000 mg | Freq: Once | INTRAVENOUS | Status: AC
  Administered 2023-11-22: 150 mg via INTRAVENOUS
  Filled 2023-11-22: qty 100

## 2023-11-22 MED ORDER — POTASSIUM CHLORIDE CRYS ER 20 MEQ PO TBCR
20.0000 meq | EXTENDED_RELEASE_TABLET | Freq: Every day | ORAL | Status: DC
  Administered 2023-11-23 – 2023-11-24 (×2): 20 meq via ORAL
  Filled 2023-11-22 (×2): qty 1

## 2023-11-22 MED ORDER — FE FUM-VIT C-VIT B12-FA 460-60-0.01-1 MG PO CAPS
1.0000 | ORAL_CAPSULE | Freq: Every day | ORAL | Status: DC
  Administered 2023-11-22 – 2023-11-24 (×3): 1 via ORAL
  Filled 2023-11-22 (×3): qty 1

## 2023-11-22 MED ORDER — AMIODARONE HCL 200 MG PO TABS
400.0000 mg | ORAL_TABLET | Freq: Two times a day (BID) | ORAL | Status: DC
  Administered 2023-11-22 (×2): 400 mg via ORAL
  Filled 2023-11-22 (×2): qty 2

## 2023-11-22 NOTE — Progress Notes (Signed)
 Physical Therapy Treatment Patient Details Name: Kristopher Flores MRN: [id number] DOB: [birth year] Today's Date: 11/22/2023   History of Present Illness Patient is a 78 yo male presenting to the ED with L sided chest pain on 11/17/23. 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 Aortic replacement completed on 3/13. Extubated 3/14. PMH includes: ascending aortic repair, HTN    PT Comments  Pt resting in bed on arrival and agreeable to session with continued progress towards acute goals. Session focused on gait with RW for support for increased activity tolerance. Pt educated on self pacing and energy conservation techniques as pt endorsing fatigue during and after mobility. Pt was educated on continued walker use to maximize functional independence, safety, and decrease risk for falls as pt with mild LOB without AD support needing min A to steady. Pt continues to benefit from skilled PT services to progress toward functional mobility goals.     If plan is discharge home, recommend the following:     Can travel by private vehicle        Equipment Recommendations  None recommended by PT    Recommendations for Other Services       Precautions / Restrictions Precautions Precautions: Sternal Precaution Booklet Issued: Yes (comment) Recall of Precautions/Restrictions: Impaired Precaution/Restrictions Comments: required cues to adhere to precautions Restrictions Weight Bearing Restrictions Per Provider Order: No Other Position/Activity Restrictions: sternal     Mobility  Bed Mobility Overal bed mobility: Needs Assistance Bed Mobility: Sit to Sidelying, Sidelying to Sit, Rolling Rolling: Contact guard assist Sidelying to sit: Contact guard assist     Sit to sidelying: Contact guard assist General bed mobility comments: CGA for safety    Transfers Overall transfer level: Modified independent Equipment used: None               General transfer comment: hands  on knees    Ambulation/Gait Ambulation/Gait assistance: Contact guard assist Gait Distance (Feet): 500 Feet Assistive device: Rolling walker (2 wheels), None Gait Pattern/deviations: Step-through pattern Gait velocity: slightly decr     General Gait Details: initally ambulating in room without AD support, mild LOB exiting BR with min A to maintain balance, pt requesting RW support, vc for proximity to RW and upright posture; very light use of UEs on RW   Stairs             Wheelchair Mobility     Tilt Bed    Modified Rankin (Stroke Patients Only)       Balance Overall balance assessment: No apparent balance deficits (not formally assessed)                                          Communication Communication Communication: No apparent difficulties  Cognition Arousal: Alert Behavior During Therapy: WFL for tasks assessed/performed   PT - Cognitive impairments: No apparent impairments                         Following commands: Intact      Cueing Cueing Techniques: Verbal cues  Exercises      General Comments General comments (skin integrity, edema, etc.): HR 77-92bpm throughout activiyt      Pertinent Vitals/Pain Pain Assessment Pain Assessment: Faces Faces Pain Scale: Hurts a little bit Pain Location: chest pain Pain Descriptors / Indicators: Guarding, Discomfort Pain Intervention(s): Monitored during session, Limited activity  within patient's tolerance    Home Living                          Prior Function            PT Goals (current goals can now be found in the care plan section) Acute Rehab PT Goals Patient Stated Goal: to feel better PT Goal Formulation: With patient Time For Goal Achievement: 12/05/23 Progress towards PT goals: Progressing toward goals    Frequency    Min 2X/week      PT Plan      Co-evaluation              AM-PAC PT "6 Clicks" Mobility   Outcome Measure   Help needed turning from your back to your side while in a flat bed without using bedrails?: A Little Help needed moving from lying on your back to sitting on the side of a flat bed without using bedrails?: A Little Help needed moving to and from a bed to a chair (including a wheelchair)?: A Little Help needed standing up from a chair using your arms (e.g., wheelchair or bedside chair)?: None Help needed to walk in hospital room?: A Little Help needed climbing 3-5 steps with a railing? : A Little 6 Click Score: 19    End of Session   Activity Tolerance: Patient tolerated treatment well Patient left: in bed;with call bell/phone within reach Nurse Communication: Mobility status PT Visit Diagnosis: Difficulty in walking, not elsewhere classified (R26.2)     Time: 1324-4010 PT Time Calculation (min) (ACUTE ONLY): 23 min  Charges:    $Gait Training: 23-37 mins PT General Charges $$ ACUTE PT VISIT: 1 Visit                     Tobi Bastos R. PTA Acute Rehabilitation Services Office: [phone]   Catalina Antigua 11/22/2023, 4:15 PM

## 2023-11-22 NOTE — Plan of Care (Signed)

## 2023-11-22 NOTE — Addendum Note (Signed)
 Addended byYvonna Alanis on: 11/22/2023 10:35 AM   Modules accepted: Orders

## 2023-11-22 NOTE — Care Management Important Message (Signed)
 Important Message  Patient Details  Name: Kristopher Flores MRN: [id number] Date of Birth: [birth year]   Important Message Given:  Yes - Medicare IM     Renie Ora 11/22/2023, 10:22 AM

## 2023-11-22 NOTE — Plan of Care (Signed)
  Problem: Education: Goal: Knowledge of General Education information will improve Description: Including pain rating scale, medication(s)/side effects and non-pharmacologic comfort measures Outcome: Progressing   Problem: Health Behavior/Discharge Planning: Goal: Ability to manage health-related needs will improve Outcome: Progressing   Problem: Clinical Measurements: Goal: Ability to maintain clinical measurements within normal limits will improve Outcome: Progressing Goal: Will remain free from infection Outcome: Progressing Goal: Diagnostic test results will improve Outcome: Progressing Goal: Respiratory complications will improve Outcome: Progressing Goal: Cardiovascular complication will be avoided Outcome: Progressing   Problem: Activity: Goal: Risk for activity intolerance will decrease Outcome: Progressing   Problem: Nutrition: Goal: Adequate nutrition will be maintained Outcome: Progressing   Problem: Coping: Goal: Level of anxiety will decrease Outcome: Progressing   Problem: Elimination: Goal: Will not experience complications related to bowel motility Outcome: Progressing Goal: Will not experience complications related to urinary retention Outcome: Progressing   Problem: Pain Managment: Goal: General experience of comfort will improve and/or be controlled Outcome: Progressing   Problem: Safety: Goal: Ability to remain free from injury will improve Outcome: Progressing   Problem: Skin Integrity: Goal: Risk for impaired skin integrity will decrease Outcome: Progressing   Problem: Education: Goal: Will demonstrate proper wound care and an understanding of methods to prevent future damage Outcome: Progressing Goal: Knowledge of disease or condition will improve Outcome: Progressing Goal: Knowledge of the prescribed therapeutic regimen will improve Outcome: Progressing Goal: Individualized Educational Video(s) Outcome: Progressing   Problem:  Activity: Goal: Risk for activity intolerance will decrease Outcome: Progressing   Problem: Cardiac: Goal: Will achieve and/or maintain hemodynamic stability Outcome: Progressing   Problem: Clinical Measurements: Goal: Postoperative complications will be avoided or minimized Outcome: Progressing   Problem: Respiratory: Goal: Respiratory status will improve Outcome: Progressing   Problem: Skin Integrity: Goal: Wound healing without signs and symptoms of infection Outcome: Progressing Goal: Risk for impaired skin integrity will decrease Outcome: Progressing   Problem: Urinary Elimination: Goal: Ability to achieve and maintain adequate renal perfusion and functioning will improve Outcome: Progressing   Problem: Education: Goal: Ability to describe self-care measures that may prevent or decrease complications (Diabetes Survival Skills Education) will improve Outcome: Progressing Goal: Individualized Educational Video(s) Outcome: Progressing   Problem: Coping: Goal: Ability to adjust to condition or change in health will improve Outcome: Progressing   Problem: Fluid Volume: Goal: Ability to maintain a balanced intake and output will improve Outcome: Progressing   Problem: Health Behavior/Discharge Planning: Goal: Ability to identify and utilize available resources and services will improve Outcome: Progressing Goal: Ability to manage health-related needs will improve Outcome: Progressing   Problem: Metabolic: Goal: Ability to maintain appropriate glucose levels will improve Outcome: Progressing   Problem: Nutritional: Goal: Maintenance of adequate nutrition will improve Outcome: Progressing Goal: Progress toward achieving an optimal weight will improve Outcome: Progressing   Problem: Skin Integrity: Goal: Risk for impaired skin integrity will decrease Outcome: Progressing   Problem: Tissue Perfusion: Goal: Adequacy of tissue perfusion will improve Outcome:  Progressing

## 2023-11-22 NOTE — Progress Notes (Signed)
 Ascending aortic root and ascending aortic replacement using a 23mm Connect Pericardial valve conduit and 32mm Hemashield graft from the graft of the connect prosthesis to the innominate artery takeoff utilizing deep hypothermic circulatory arrest and femoral cannulation by Dr. Leafy Ro on 11/17/2023. Cardiologist is Dr. Lacretia Nicks. O'Neal. Patient needs a 2 week follow up from 03/19 and a 6 week echo.

## 2023-11-22 NOTE — Progress Notes (Signed)
      [address]       [place] [id number]             [phone]        5 Days Post-Op Procedure(s) (LRB): REPAIR OF ASCENDING AORTIC DISSECTION USING A 23mm KONECT RESILIA AORTIC VALVE CONDUIT AND 32X10MM X 50CM HEMASHIELD PLATINUM GRAFT. (N/A) ECHOCARDIOGRAM, TRANSESOPHAGEAL, INTRAOPERATIVE (N/A)  Subjective: Patient has no complaint this am. He had a bowel movement. He asked if cell phones work in hospital as his seems to not be working.  Objective: Vital signs in last 24 hours: Temp:  [97.4 F (36.3 C)-99 F (37.2 C)] 97.4 F (36.3 C) (03/18 0254) Pulse Rate:  [70-91] 70 (03/18 0254) Cardiac Rhythm: Normal sinus rhythm;Bundle branch block (03/17 1900) Resp:  [18] 18 (03/18 0254) BP: (91-120)/(45-69) 109/52 (03/18 0254) SpO2:  [90 %-96 %] 90 % (03/18 0254) Weight:  [81.7 kg] 81.7 kg (03/18 0254)  Pre op weight 81.6 kg Current Weight  11/22/23 81.7 kg      Intake/Output from previous day: 03/17 0701 - 03/18 0700 In: 343 [P.O.:340; I.V.:3] Out: 400 [Urine:400]   Physical Exam:  Cardiovascular: RRR Pulmonary: Slightly diminished bibasilar breath sounds L>R Abdomen: Soft, non tender, bowel sounds present. Extremities: Trace bilateral lower extremity edema. Wounds: Sternal and right groin wounds are clean and dry.  No erythema or signs of infection.  Lab Results: CBC: Recent Labs    11/20/23 0450 11/22/23 0431  WBC 15.4* 13.4*  HGB 7.9* 7.6*  HCT 23.3* 22.7*  PLT 117* 227   BMET:  Recent Labs    11/20/23 0450 11/22/23 0431  NA 137 135  K 4.1 3.9  CL 104 100  CO2 25 27  GLUCOSE 105* 107*  BUN 17 21  CREATININE 1.01 1.19  CALCIUM 8.2* 8.2*    PT/INR:  Lab Results  Component Value Date   INR 1.2 11/19/2023   INR 1.6 (H) 11/17/2023   ABG:  INR: Will add last result for INR, ABG once components are confirmed Will add last 4 CBG results once components are confirmed  Assessment/Plan:  1. CV - . Mostly SR (PACs).On  prophylactic Amiodarone 100 mg bid, Lopressor 25 mg bid, and Lisinopril 20 mg daily. As discussed with Dr. Leafy Ro, will increase Amiodarone 400 mg bid. 2.  Pulmonary - On room air. Encourage incentive spirometer and flutter valve. 3. Above pre op weight, requires further diuresis-continue Lasix 40 mg daily 4.  Expected post op acute blood loss anemia -  H and H slightly decreased to 7.6 and 22.7. Start oral iron 5. Mild thrombocytopenia resolved-Platelets this am up to 227,000 6. Deconditioned-will consult PT/OT. Patient has someone to help him at home when ready for discharge. He has been ambulating in hallway at least 2 times daily. Likely discharge in am.   Lelon Huh ZimmermanPA-C 6:57 AM

## 2023-11-22 NOTE — Progress Notes (Addendum)
      [address]       [place] [address]             [phone]    During morning meeting the RN made me aware that the patient was having sustained sinus tachycardia in the 130s-140s. When looking at the tele monitor it was reading sinus tachycardia in the 130s but an EKG was recommended since it looked like the patient may have developed atrial fibrillation or atrial flutter. By the time EKG was taken the patient's heart rate was in the 80s and EKG read NSR with PACs. His PO Amiodarone had been increased to 400mg  BID and he was given an IV Amiodarone bolus. The patient remained stable throughout and had no complaints. He denied, SOB, chest pain, chest palpitations, dizziness, nausea and vomiting. Vital signs remained stable.   Addendum: Per RN the patient developed rates into the 140s again around 10:30AM and EKG showed atrial flutter. The Amiodarone bolus and PO Amiodarone had been given about 30 minutes prior to atrial flutter and the patient chemically converted to NSR after 20 minutes. He has since remained in NSR with rates in the 80s.  Today's Vitals   11/22/23 0254 11/22/23 0717 11/22/23 0800 11/22/23 1200  BP: (!) 109/52 (!) 109/55  100/60  Pulse: 70 81  93  Resp: 18 17  16   Temp: (!) 97.4 F (36.3 C) 97.7 F (36.5 C)  (!) 97.5 F (36.4 C)  TempSrc: Oral Oral  Oral  SpO2: 90%   95%  Weight: 81.7 kg     Height:      PainSc:  0-No pain 0-No pain 0-No pain   Body mass index is 25.84 kg/m.  Jenny Reichmann, PA-C 11/22/23

## 2023-11-22 NOTE — Progress Notes (Addendum)
 Patient observed with HR in 140's, EKG performed placed in chart, interpreted as A-flutter, PA was made aware, new orders placed.

## 2023-11-22 NOTE — Progress Notes (Addendum)
 CARDIAC REHAB PHASE I    Stopped by to offer walk in hallway. Pt developed a-fib RVR this am. Will hold walk for now. Will return for mobility and education at a later time.   [id number] Woodroe Chen, RN BSN 11/22/2023 10:36 AM

## 2023-11-23 LAB — MAGNESIUM: Magnesium: 2 mg/dL (ref 1.7–2.4)

## 2023-11-23 MED ORDER — AMIODARONE IV BOLUS ONLY 150 MG/100ML
150.0000 mg | Freq: Once | INTRAVENOUS | Status: AC
  Administered 2023-11-23: 150 mg via INTRAVENOUS
  Filled 2023-11-23: qty 100

## 2023-11-23 MED ORDER — ASPIRIN 81 MG PO CHEW
324.0000 mg | CHEWABLE_TABLET | Freq: Every day | ORAL | Status: DC
  Filled 2023-11-23: qty 4

## 2023-11-23 MED ORDER — AMIODARONE HCL IN DEXTROSE 360-4.14 MG/200ML-% IV SOLN: 30.0000 mg/h | INTRAVENOUS | Status: DC

## 2023-11-23 MED ORDER — AMIODARONE HCL 200 MG PO TABS
400.0000 mg | ORAL_TABLET | Freq: Two times a day (BID) | ORAL | Status: DC
  Administered 2023-11-23 – 2023-11-24 (×3): 400 mg via ORAL
  Filled 2023-11-23 (×3): qty 2

## 2023-11-23 MED ORDER — ASPIRIN 81 MG PO TBEC
81.0000 mg | DELAYED_RELEASE_TABLET | Freq: Every day | ORAL | Status: DC
  Administered 2023-11-23: 81 mg via ORAL
  Filled 2023-11-23: qty 1

## 2023-11-23 MED ORDER — ASPIRIN 81 MG PO CHEW: 81.0000 mg | CHEWABLE_TABLET | Freq: Every day | ORAL | Status: DC

## 2023-11-23 MED ORDER — POTASSIUM CHLORIDE CRYS ER 20 MEQ PO TBCR
20.0000 meq | EXTENDED_RELEASE_TABLET | Freq: Once | ORAL | Status: AC
  Administered 2023-11-23: 20 meq via ORAL
  Filled 2023-11-23: qty 1

## 2023-11-23 MED ORDER — ASPIRIN 325 MG PO TBEC
325.0000 mg | DELAYED_RELEASE_TABLET | Freq: Every day | ORAL | Status: DC
  Administered 2023-11-24: 325 mg via ORAL
  Filled 2023-11-23: qty 1

## 2023-11-23 MED ORDER — AMIODARONE HCL IN DEXTROSE 360-4.14 MG/200ML-% IV SOLN
60.0000 mg/h | INTRAVENOUS | Status: DC
  Administered 2023-11-23: 60 mg/h via INTRAVENOUS
  Filled 2023-11-23: qty 200

## 2023-11-23 MED ORDER — AMIODARONE HCL 200 MG PO TABS: 400.0000 mg | ORAL_TABLET | Freq: Two times a day (BID) | ORAL | Status: DC

## 2023-11-23 NOTE — Progress Notes (Signed)
 Verbal order from [facility] PA for 150 mg IV amiodarone bolus once, and one time dose of  20 mEq Potassium

## 2023-11-23 NOTE — Progress Notes (Addendum)
 On my arrival pt in afib 110-120s, SBP 100. While discussing ambulation, pt suddenly converted to NSR, 64, pt denied sx. BP then 79/69. Denied sx. Pt back in to afib 120s. Notified RN, continued low BP. Will f/u later for ambulation and education.  [id number] Ethelda Chick BS, ACSM-CEP 11/23/2023 9:31 AM

## 2023-11-23 NOTE — TOC Initial Note (Signed)
 Transition of Care (TOC) - Initial/Assessment Note  Donn Pierini RN, BSN Transitions of Care Unit 4E- RN Case Manager See Treatment Team for direct phone #   Patient Details  Name: Kristopher Flores MRN: [id number] Date of Birth: 29-Jul-1946  Transition [facility] Care [facility]) CM/SW Contact:    Darrold Span, RN Phone Number: 11/23/2023, 3:45 PM  Clinical Narrative:                 Pt s/p Aortic dissection repair- from home alone, has SO- however they will not be available to assist on discharge.   Cardiac rehab notified CM that pt needs RW for home Order for DME-RW placed- CM spoke with pt at bedside- pt voiced he does not have preference on provider only that he would like RW prior to leaving hospital.   Referral sent to [employer] liaison for DME - RW- [employer] to deliver to room prior to discharge.   Expected Discharge Plan: Home/Self Care Barriers to Discharge: Continued Medical Work up   Patient Goals and CMS Choice Patient states their goals for this hospitalization and ongoing recovery are:: return home CMS Medicare.gov Compare Post Acute Care list provided to:: Patient Choice offered to / list presented to : Patient      Expected Discharge Plan and Services   Discharge Planning Services: CM Consult Post Acute Care Choice: Durable Medical Equipment Living arrangements for the past 2 months: Apartment                 DME Arranged: Walker rolling DME Agency: [employer] Date DME Agency Contacted: 11/23/23 Time DME Agency Contacted: [phone] Representative spoke with at DME Agency: Ian Malkin HH Arranged: NA HH Agency: NA        Prior Living Arrangements/Services Living arrangements for the past 2 months: Apartment Lives with:: Self Patient language and need for interpreter reviewed:: Yes Do you feel safe going back to the place where you live?: Yes      Need for Family Participation in Patient Care: Yes (Comment) Care giver support system in place?: Yes (comment)   Criminal  Activity/Legal Involvement Pertinent to Current Situation/Hospitalization: No - Comment as needed  Activities of Daily Living   ADL Screening (condition at time of admission) Independently performs ADLs?: Yes (appropriate for developmental age) Is the patient deaf or have difficulty hearing?: No Does the patient have difficulty seeing, even when wearing glasses/contacts?: No Does the patient have difficulty concentrating, remembering, or making decisions?: No  Permission Sought/Granted Permission sought to share information with : Facility Industrial/product designer granted to share information with : Yes, Verbal Permission Granted     Permission granted to share info w AGENCY: DME        Emotional Assessment Appearance:: Appears stated age Attitude/Demeanor/Rapport: Engaged Affect (typically observed): Accepting Orientation: : Oriented to Self, Oriented to Place, Oriented to  Time, Oriented to Situation Alcohol / Substance Use: Not Applicable Psych Involvement: No (comment)  Admission diagnosis:  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 [D63.875] Patient Active Problem List   Diagnosis Date Noted   S/P aortic dissection repair 11/17/2023   PCP:  Soundra Pilon, FNP Pharmacy:   [facility] [id number] - [place], [place] - [address] [place] [address] Phone: [phone] Fax: [phone]     Social Drivers of Health (SDOH) Social History: SDOH Screenings   Food Insecurity: No Food Insecurity (11/18/2023)  Housing: Low Risk  (11/18/2023)  Transportation Needs: No Transportation Needs (11/18/2023)  Utilities:  Not At Risk (11/18/2023)  Social Connections: Moderately Isolated (11/18/2023)  Tobacco Use: Low Risk  (11/17/2023)   SDOH Interventions:     Readmission Risk Interventions     No data to display

## 2023-11-23 NOTE — Anesthesia Postprocedure Evaluation (Signed)
 Anesthesia Post Note  Patient: [name]  Procedure(s) Performed: REPAIR OF ASCENDING AORTIC DISSECTION USING A 23mm KONECT RESILIA AORTIC VALVE CONDUIT AND 32X10MM X 50CM HEMASHIELD PLATINUM GRAFT. (Chest) ECHOCARDIOGRAM, TRANSESOPHAGEAL, INTRAOPERATIVE (Esophagus)     Patient location during evaluation: SICU Anesthesia Type: General Level of consciousness: sedated Pain management: pain level controlled Vital Signs Assessment: post-procedure vital signs reviewed and stable Respiratory status: patient remains intubated per anesthesia plan Cardiovascular status: stable Postop Assessment: no apparent nausea or vomiting Anesthetic complications: no   No notable events documented.                Tanis Burnley

## 2023-11-23 NOTE — Progress Notes (Signed)
   11/23/23 0818  Assess: MEWS Score  Temp 97.9 F (36.6 C)  BP 129/61  MAP (mmHg) 76  Pulse Rate (!) 130  ECG Heart Rate (!) 120  Resp 20  Level of Consciousness Alert  SpO2 95 %  O2 Device Room Air  Assess: MEWS Score  MEWS Temp 0  MEWS Systolic 0  MEWS Pulse 2  MEWS RR 0  MEWS LOC 0  MEWS Score 2  MEWS Score Color Yellow  Assess: if the MEWS score is Yellow or Red  Were vital signs accurate and taken at a resting state? Yes  Does the patient meet 2 or more of the SIRS criteria? No  MEWS guidelines implemented  Yes, yellow  Treat  MEWS Interventions Considered administering scheduled or prn medications/treatments as ordered  Take Vital Signs  Increase Vital Sign Frequency  Yellow: Q2hr x1, continue Q4hrs until patient remains green for 12hrs  Escalate  MEWS: Escalate Yellow: Discuss with charge nurse and consider notifying provider and/or RRT  Notify: Charge Nurse/RN  Name of Charge Nurse/RN Notified Lexicographer  Assess: SIRS CRITERIA  SIRS Temperature  0  SIRS Respirations  0  SIRS Pulse 1  SIRS WBC 0  SIRS Score Sum  1   PA aware started on amiodarone gtt

## 2023-11-23 NOTE — Progress Notes (Signed)
 I returned to 4E and patient's heart rate in the 120-130"s in a fib. Will start Amiodarone drip. ? If should check echocardiogram to rule out pericardial effusion. He will likely need anticoagulation as well. Disposition to be determined (once a fib rate controlled or restore NSR, will consider discharge)

## 2023-11-23 NOTE — Progress Notes (Addendum)
      [address]       [place] [address]             [phone]        6 Days Post-Op Procedure(s) (LRB): REPAIR OF ASCENDING AORTIC DISSECTION USING A 23mm KONECT RESILIA AORTIC VALVE CONDUIT AND 32X10MM X 50CM HEMASHIELD PLATINUM GRAFT. (N/A) ECHOCARDIOGRAM, TRANSESOPHAGEAL, INTRAOPERATIVE (N/A)  Subjective: Patient has no complaint this am except back starting to hurt from bed.  Objective: Vital signs in last 24 hours: Temp:  [97.5 F (36.4 C)-98.2 F (36.8 C)] 98.2 F (36.8 C) (03/19 0438) Pulse Rate:  [55-93] 55 (03/19 0438) Cardiac Rhythm: Normal sinus rhythm (03/18 2038) Resp:  [16-19] 18 (03/19 0438) BP: (90-117)/(52-60) 107/52 (03/19 0438) SpO2:  [92 %-96 %] 95 % (03/19 0438) Weight:  [81.3 kg] 81.3 kg (03/19 0439)  Pre op weight 81.6 kg Current Weight  11/23/23 81.3 kg      Intake/Output from previous day: 03/18 0701 - 03/19 0700 In: 375 [P.O.:275; I.V.:100] Out: 300 [Urine:300]   Physical Exam:  Cardiovascular: RRR, distant heart sounds Pulmonary: Slightly diminished bibasilar breath sounds L>R Abdomen: Soft, non tender, bowel sounds present. Extremities: Trace bilateral lower extremity edema. Wounds: Sternal and right groin wounds are clean and dry.  No erythema or signs of infection.  Lab Results: CBC: Recent Labs    11/22/23 0431  WBC 13.4*  HGB 7.6*  HCT 22.7*  PLT 227   BMET:  Recent Labs    11/22/23 0431  NA 135  K 3.9  CL 100  CO2 27  GLUCOSE 107*  BUN 21  CREATININE 1.19  CALCIUM 8.2*    PT/INR:  Lab Results  Component Value Date   INR 1.2 11/19/2023   INR 1.6 (H) 11/17/2023   ABG:  INR: Will add last result for INR, ABG once components are confirmed Will add last 4 CBG results once components are confirmed  Assessment/Plan:  1. CV - He had a fib with RVR yesterday am. SR this am. He was given IV Amiodarone bolus, Amiodarone 400 mg bid, and Lopressor was increased to 25 mg bid. With SBP in  the low 100's, will stop Lisinopril for now. 2.  Pulmonary - On room air. Encourage incentive spirometer and flutter valve. 3. He is slightly above pre op weight, requires further diuresis-continue Lasix 40 mg daily 4.  Expected post op acute blood loss anemia -  Last H and H slightly decreased to 7.6 and 22.7. Continue oral iron 5. Deconditioned-will consult PT/OT. Patient has someone to help him at home when ready for discharge. He has been ambulating in hallway at least 2 times daily.  6. Disposition-monitor rhythm/heart rate this am. ? Discharge later today vs am  Donielle M ZimmermanPA-C 6:58 AM  Agree with above

## 2023-11-23 NOTE — Progress Notes (Signed)
 Occupational Therapy Treatment Patient Details Name: Kristopher Flores MRN: [id number] DOB: 11/06/1945 Today's Date: 11/23/2023   History of present illness Patient is a 78 yo male presenting to the ED with L sided chest pain on 11/17/23. 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 Aortic replacement completed on 3/13. Extubated 3/14. PMH includes: ascending aortic repair, HTN   OT comments  Session focus on increasing overall activity tolerance with functional mobility and ADL management. Patient with continued ability to adhere to sternal precautions, and CGA for ADL management and mod I for transfers. HR no higher than 83 with functional mobility. Recommendation remains appropriate from OT perspective.       If plan is discharge home, recommend the following:  A little help with walking and/or transfers;A little help with bathing/dressing/bathroom;Assistance with cooking/housework;Assist for transportation;Help with stairs or ramp for entrance (initially)   Equipment Recommendations  None recommended by OT    Recommendations for Other Services      Precautions / Restrictions Precautions Precautions: Sternal Precaution Booklet Issued: Yes (comment) Recall of Precautions/Restrictions: Intact Restrictions Weight Bearing Restrictions Per Provider Order: No Other Position/Activity Restrictions: sternal       Mobility Bed Mobility Overal bed mobility: Needs Assistance Bed Mobility: Sit to Sidelying, Sidelying to Sit, Rolling Rolling: Contact guard assist Sidelying to sit: Contact guard assist     Sit to sidelying: Contact guard assist General bed mobility comments: CGA for safety    Transfers Overall transfer level: Modified independent Equipment used: None               General transfer comment: heart pillo     Balance Overall balance assessment: No apparent balance deficits (not formally assessed)                                          ADL either performed or assessed with clinical judgement   ADL Overall ADL's : Needs assistance/impaired     Grooming: Wash/dry face;Wash/dry hands;Set up;Sitting           Upper Body Dressing : Set up;Sitting       Toilet Transfer: Modified Independent Toilet Transfer Details (indicate cue type and reason): able to come into standing with heart pillow without difficulty         Functional mobility during ADLs: Cueing for sequencing;Cueing for safety;Contact guard assist General ADL Comments: Session focus on increasing overall activity tolerance with functional mobility and ADL management. Patient with continued ability to adhere to sternal precautions, and CGA for ADL management and mod I for transfers. HR no higher than 83 with functional mobility. Recommendation remains appropriate from OT perspective.    Extremity/Trunk Assessment              Vision       Restaurant manager, fast food Communication: No apparent difficulties   Cognition Arousal: Alert Behavior During Therapy: WFL for tasks assessed/performed Cognition: No apparent impairments                               Following commands: Intact        Cueing   Cueing Techniques: Verbal cues  Exercises      Shoulder Instructions       General Comments HR no higher than 83  Pertinent Vitals/ Pain       Pain Assessment Pain Assessment: Faces Faces Pain Scale: No hurt Pain Descriptors / Indicators: Guarding, Discomfort Pain Intervention(s): Limited activity within patient's tolerance, Monitored during session, Repositioned  Home Living                                          Prior Functioning/Environment              Frequency  Min 2X/week        Progress Toward Goals  OT Goals(current goals can now be found in the care plan section)  Progress towards OT goals: Progressing toward goals  Acute Rehab OT  Goals Patient Stated Goal: to get to the golf course OT Goal Formulation: With patient Time For Goal Achievement: 12/05/23 Potential to Achieve Goals: Good  Plan      Co-evaluation                 AM-PAC OT "6 Clicks" Daily Activity     Outcome Measure   Help from another person eating meals?: A Little Help from another person taking care of personal grooming?: A Little Help from another person toileting, which includes using toliet, bedpan, or urinal?: A Little Help from another person bathing (including washing, rinsing, drying)?: A Little Help from another person to put on and taking off regular upper body clothing?: A Little Help from another person to put on and taking off regular lower body clothing?: A Little 6 Click Score: 18    End of Session Equipment Utilized During Treatment: Gait belt;Rolling walker (2 wheels)  OT Visit Diagnosis: Unsteadiness on feet (R26.81);Other abnormalities of gait and mobility (R26.89);Pain   Activity Tolerance Patient tolerated treatment well   Patient Left in bed;with call bell/phone within reach   Nurse Communication Mobility status        Time: 4098-1191 OT Time Calculation (min): 26 min  Charges: OT General Charges $OT Visit: 1 Visit OT Treatments $Self Care/Home Management : 23-37 mins  Pollyann Glen E. Demontez Novack, OTR/L Acute Rehabilitation Services [phone]   [name] 11/23/2023, 4:03 PM

## 2023-11-24 ENCOUNTER — Other Ambulatory Visit (HOSPITAL_COMMUNITY): Payer: Self-pay

## 2023-11-24 LAB — POTASSIUM: Potassium: 4.3 mmol/L (ref 3.5–5.1)

## 2023-11-24 LAB — BASIC METABOLIC PANEL
Anion gap: 9 (ref 5–15)
BUN: 20 mg/dL (ref 8–23)
CO2: 24 mmol/L (ref 22–32)
Calcium: 8.8 mg/dL — ABNORMAL LOW (ref 8.9–10.3)
Chloride: 105 mmol/L (ref 98–111)
Creatinine, Ser: 1.14 mg/dL (ref 0.61–1.24)
GFR, Estimated: >60 mL/min (ref >60)
Glucose, Bld: 94 mg/dL (ref 70–99)
Potassium: 5.3 mmol/L — ABNORMAL HIGH (ref 3.5–5.1)
Sodium: 138 mmol/L (ref 135–145)

## 2023-11-24 LAB — MAGNESIUM: Magnesium: 2.1 mg/dL (ref 1.7–2.4)

## 2023-11-24 MED ORDER — POTASSIUM CHLORIDE CRYS ER 10 MEQ PO TBCR
10.0000 meq | EXTENDED_RELEASE_TABLET | Freq: Every day | ORAL | 0 refills | Status: DC
  Filled 2023-11-24: qty 3, 3d supply, fill #0

## 2023-11-24 MED ORDER — OXYCODONE HCL 5 MG PO TABS
5.0000 mg | ORAL_TABLET | Freq: Four times a day (QID) | ORAL | 0 refills | Status: DC | PRN
  Filled 2023-11-24: qty 30, 7d supply, fill #0

## 2023-11-24 MED ORDER — AMIODARONE HCL 200 MG PO TABS: ORAL_TABLET | ORAL | 1 refills | Status: DC

## 2023-11-24 MED ORDER — METOPROLOL TARTRATE 25 MG PO TABS
25.0000 mg | ORAL_TABLET | Freq: Two times a day (BID) | ORAL | 1 refills | Status: AC
Start: 2023-11-24 — End: ?
  Filled 2023-11-24: qty 60, 30d supply, fill #0

## 2023-11-24 MED ORDER — AMIODARONE HCL 200 MG PO TABS
ORAL_TABLET | ORAL | 1 refills | Status: DC
  Filled 2023-11-24: qty 120, 39d supply, fill #0

## 2023-11-24 MED ORDER — SODIUM ZIRCONIUM CYCLOSILICATE 5 G PO PACK
5.0000 g | PACK | Freq: Once | ORAL | Status: AC
  Administered 2023-11-24: 5 g via ORAL
  Filled 2023-11-24: qty 1

## 2023-11-24 MED ORDER — POTASSIUM CHLORIDE CRYS ER 10 MEQ PO TBCR
10.0000 meq | EXTENDED_RELEASE_TABLET | Freq: Every day | ORAL | 0 refills | Status: DC
  Filled 2023-11-24: qty 4, 4d supply, fill #0

## 2023-11-24 MED ORDER — FUROSEMIDE 40 MG PO TABS
40.0000 mg | ORAL_TABLET | Freq: Every day | ORAL | 0 refills | Status: DC
  Filled 2023-11-24 (×2): qty 6, 6d supply, fill #0

## 2023-11-24 MED ORDER — ASPIRIN 325 MG PO TBEC: 325.0000 mg | DELAYED_RELEASE_TABLET | Freq: Every day | ORAL | Status: DC

## 2023-11-24 MED ORDER — ROSUVASTATIN CALCIUM 40 MG PO TABS
40.0000 mg | ORAL_TABLET | Freq: Every day | ORAL | 1 refills | Status: DC
  Filled 2023-11-24: qty 30, 30d supply, fill #0

## 2023-11-24 NOTE — Progress Notes (Signed)
 Physical Therapy Treatment Patient Details Name: Kristopher Flores MRN: [id number] DOB: 1946-01-14 Today's Date: 11/24/2023   History of Present Illness Patient is a 78 yo male presenting to the ED with L sided chest pain on 11/17/23. 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 Aortic replacement completed on 3/13. Extubated 3/14. PMH includes: ascending aortic repair, HTN    PT Comments  Pt in bed upon arrival and agreeable to PT session. Pt is able to verbalize precautions, however, needs cues for adherence during transfers. He was able to ambulate with supervision and RW for ~240 ft. Pt also ascended/descended a flight of steps with 1 HR and CGA for safety. Pt reported feeling lightheaded after stair negotiation and required a seated rest break (HR 65-70 BPM). After a few minutes, his symptoms reduced and he was able to stand from the lowered step height with MinA. Pt is progressing well towards goals. Acute PT to follow.      If plan is discharge home, recommend the following: Assist for transportation;Help with stairs or ramp for entrance   Can travel by private vehicle      Yes  Equipment Recommendations  None recommended by PT       Precautions / Restrictions Precautions Precautions: Sternal Precaution Booklet Issued: Yes (comment) Recall of Precautions/Restrictions: Impaired Precaution/Restrictions Comments: required cues to adhere to sternal precautions for standing/sitting Restrictions Weight Bearing Restrictions Per Provider Order: No Other Position/Activity Restrictions: sternal     Mobility  Bed Mobility Overal bed mobility: Needs Assistance Bed Mobility: Sit to Sidelying, Sidelying to Sit, Rolling Rolling: Contact guard assist Sidelying to sit: Contact guard assist    Sit to sidelying: Contact guard assist General bed mobility comments: cues for technique, pt held onto cardiac pillow with CGA for safety    Transfers Overall transfer level: Needs  assistance Equipment used: Rolling walker (2 wheels) Transfers: Sit to/from Stand Sit to Stand: Supervision, Min assist    General transfer comment: cues for push with hands on knees to stand, supervision for safety from EOB. MinA from lowered step height    Ambulation/Gait Ambulation/Gait assistance: Supervision Gait Distance (Feet): 240 Feet Assistive device: Rolling walker (2 wheels), None Gait Pattern/deviations: Step-through pattern    General Gait Details: steady with no LOB, light use of UE on RW   Stairs Stairs: Yes Stairs assistance: Contact guard assist Stair Management: One rail Right, Alternating pattern, Step to pattern, Forwards Number of Stairs: 10 General stair comments: step through pattern to ascend, step to pattern to descend. Felt lightheaded after completing flight of steps with seated rest break on steps. HR ~65-70 BPM. After ~2 minutes pt reported decrease in symptoms, RN notified     Balance Overall balance assessment: No apparent balance deficits (not formally assessed)         Communication Communication Communication: No apparent difficulties  Cognition Arousal: Alert Behavior During Therapy: WFL for tasks assessed/performed   PT - Cognitive impairments: No apparent impairments  Following commands: Intact      Cueing Cueing Techniques: Verbal cues         Pertinent Vitals/Pain Pain Assessment Pain Assessment: No/denies pain     PT Goals (current goals can now be found in the care plan section) Acute Rehab PT Goals PT Goal Formulation: With patient Time For Goal Achievement: 12/05/23 Potential to Achieve Goals: Good Progress towards PT goals: Progressing toward goals    Frequency    Min 2X/week       AM-PAC PT "6  Clicks" Mobility   Outcome Measure  Help needed turning from your back to your side while in a flat bed without using bedrails?: A Little Help needed moving from lying on your back to sitting on the side of a flat  bed without using bedrails?: A Little Help needed moving to and from a bed to a chair (including a wheelchair)?: A Little Help needed standing up from a chair using your arms (e.g., wheelchair or bedside chair)?: A Little Help needed to walk in hospital room?: A Little Help needed climbing 3-5 steps with a railing? : A Little 6 Click Score: 18    End of Session Equipment Utilized During Treatment: Gait belt Activity Tolerance: Patient tolerated treatment well Patient left: in bed;with call bell/phone within reach Nurse Communication: Mobility status (symptoms of lightheadedness) PT Visit Diagnosis: Difficulty in walking, not elsewhere classified (R26.2)     Time: 1610-9604 PT Time Calculation (min) (ACUTE ONLY): 23 min  Charges:    $Gait Training: 8-22 mins $Therapeutic Activity: 8-22 mins PT General Charges $$ ACUTE PT VISIT: 1 Visit                    Hilton Cork, PT, DPT Secure Chat Preferred  Rehab Office [phone]   Arturo Morton Brion Aliment 11/24/2023, 10:45 AM

## 2023-11-24 NOTE — Progress Notes (Signed)
 Patient given discharge instructions, medication list and follow up appointments, IV and tele were dcd will discharge home as ordered. Pt has RW at bedside for home use, medications sent to [facility] pharmacy. Waylan Busta, Randall An RN

## 2023-11-24 NOTE — TOC Transition Note (Signed)
 Transition of Care (TOC) - Discharge Note Donn Pierini RN, BSN Transitions of Care Unit 4E- RN Case Manager See Treatment Team for direct phone #   Patient Details  Name: Kristopher Flores MRN: [id number] Date of Birth: 06/01/1946  [facility] of [facility]) CM/SW Contact:  Darrold Span, RN Phone Number: 11/24/2023, 12:49 PM   Clinical Narrative:    Pt for discharge home today, DME-RW has been set up with [employer] for delivery to the room.  No further HH or DME needs noted.   Pt has transportation home.    Final next level of care: Home/Self Care Barriers to Discharge: Barriers Resolved   Patient Goals and CMS Choice Patient states their goals for this hospitalization and ongoing recovery are:: return home CMS Medicare.gov Compare Post Acute Care list provided to:: Patient Choice offered to / list presented to : Patient      Discharge Placement                 Home      Discharge Plan and Services Additional resources added to the After Visit Summary for     Discharge Planning Services: CM Consult Post Acute Care Choice: Durable Medical Equipment          DME Arranged: Dan Humphreys rolling DME Agency: [employer] Date DME Agency Contacted: 11/23/23 Time DME Agency Contacted: [phone] Representative spoke with at DME Agency: Ian Malkin HH Arranged: NA HH Agency: NA        Social Drivers of Health (SDOH) Interventions SDOH Screenings   Food Insecurity: No Food Insecurity (11/18/2023)  Housing: Low Risk  (11/18/2023)  Transportation Needs: No Transportation Needs (11/18/2023)  Utilities: Not At Risk (11/18/2023)  Social Connections: Moderately Isolated (11/18/2023)  Tobacco Use: Low Risk  (11/17/2023)     Readmission Risk Interventions    11/24/2023   12:49 PM  Readmission Risk Prevention Plan  Post Dischage Appt Complete  Medication Screening Complete  Transportation Screening Complete

## 2023-11-24 NOTE — Progress Notes (Signed)
 CARDIAC REHAB PHASE I    Post OHS education including site care, restrictions, heart healthy diet, sternal precautions, IS use at home, home needs at discharge and exercise guidelines. All questions and concerns addressed. Discharge home today.   [id number] Woodroe Chen, RN BSN 11/24/2023 1:57 PM

## 2023-11-24 NOTE — Progress Notes (Addendum)
      [address]       [place] [address]             [phone]        7 Days Post-Op Procedure(s) (LRB): REPAIR OF ASCENDING AORTIC DISSECTION USING A 23mm KONECT RESILIA AORTIC VALVE CONDUIT AND 32X10MM X 50CM HEMASHIELD PLATINUM GRAFT. (N/A) ECHOCARDIOGRAM, TRANSESOPHAGEAL, INTRAOPERATIVE (N/A)  Subjective: Patient asking for ice water this am. He has no complaints otherwise.  Objective: Vital signs in last 24 hours: Temp:  [97.5 F (36.4 C)-98.1 F (36.7 C)] 97.5 F (36.4 C) (03/20 0314) Pulse Rate:  [53-130] 57 (03/19 1546) Cardiac Rhythm: Atrial fibrillation (03/19 0715) Resp:  [16-20] 20 (03/20 0314) BP: (76-129)/(47-70) 109/48 (03/20 0314) SpO2:  [91 %-100 %] 98 % (03/20 0314) Weight:  [79.8 kg] 79.8 kg (03/20 0314)  Pre op weight 81.6 kg Current Weight  11/24/23 79.8 kg      Intake/Output from previous day: 03/19 0701 - 03/20 0700 In: 146 [I.V.:146] Out: 1325 [Urine:1325]   Physical Exam:  Cardiovascular: RRR Pulmonary: Clear to auscultation bilaterally Abdomen: Soft, non tender, bowel sounds present. Extremities: No lower extremity edema. Wounds: Sternal and right groin wounds are clean and dry.  No erythema or signs of infection.  Lab Results: CBC: Recent Labs    11/22/23 0431  WBC 13.4*  HGB 7.6*  HCT 22.7*  PLT 227   BMET:  Recent Labs    11/22/23 0431  NA 135  K 3.9  CL 100  CO2 27  GLUCOSE 107*  BUN 21  CREATININE 1.19  CALCIUM 8.2*    PT/INR:  Lab Results  Component Value Date   INR 1.2 11/19/2023   INR 1.6 (H) 11/17/2023   ABG:  INR: Will add last result for INR, ABG once components are confirmed Will add last 4 CBG results once components are confirmed  Assessment/Plan: CV-A fib with RVR. An Amiodarone drip was ordered yesterday but was cancelled by surgeon (? he was in sinus rhythm) . He did receive a few IV Amiodarone boluses (last one around 6:40 pm) and Lopressor 25 mg bid. Per Dr. Leafy Ro,  he was given oral Amiodarone yesterday Per. Dr. Leafy Ro, no anticoagulation at this time. Pulmonary-On room air. Encourage incentive spirometer. Expected post op blood loss anemia-Last H and H 7.6 and 22.7. Continue oral iron. Deconditioned-improved ambulation since transfer Disposition-as discussed with Dr. Leafy Ro, discharge  [employer] M ZimmermanPA-C 7:03 AM

## 2023-11-28 ENCOUNTER — Other Ambulatory Visit: Payer: Self-pay | Admitting: Thoracic Surgery (Cardiothoracic Vascular Surgery)

## 2023-11-28 DIAGNOSIS — Z9889 Other specified postprocedural states: Secondary | ICD-10-CM

## 2023-11-29 NOTE — Patient Instructions (Signed)
Continue to avoid any heavy lifting or strenuous use of your arms or shoulders for at least a total of three months from the time of surgery.  After three months you may gradually increase how much you lift or otherwise use your arms or chest as tolerated, with limits based upon whether or not activities lead to the return of significant discomfort.  Endocarditis is a potentially serious infection of heart valves or inside lining of the heart.  It occurs more commonly in patients with diseased heart valves (such as patient's with aortic or mitral valve disease) and in patients who have undergone heart valve repair or replacement.  Certain surgical and dental procedures may put you at risk, such as dental cleaning, other dental procedures, or any surgery involving the respiratory, urinary, gastrointestinal tract, gallbladder or prostate gland.   To minimize your chances for develooping endocarditis, maintain good oral health and seek prompt medical attention for any infections involving the mouth, teeth, gums, skin or urinary tract.    Always notify your doctor or dentist about your underlying heart valve condition before having any invasive procedures. You will need to take antibiotics before certain procedures, including all routine dental cleanings or other dental procedures.  Your cardiologist or dentist should prescribe these antibiotics for you to be taken ahead of time.      

## 2023-11-30 ENCOUNTER — Ambulatory Visit (INDEPENDENT_AMBULATORY_CARE_PROVIDER_SITE_OTHER): Payer: Self-pay | Admitting: Physician Assistant

## 2023-11-30 ENCOUNTER — Encounter: Payer: Self-pay | Admitting: Physician Assistant

## 2023-11-30 ENCOUNTER — Ambulatory Visit
Admission: RE | Admit: 2023-11-30 | Discharge: 2023-11-30 | Disposition: A | Discharge status: Home / Self Care | Source: Ambulatory Visit | Attending: Thoracic Surgery (Cardiothoracic Vascular Surgery) | Admitting: Thoracic Surgery (Cardiothoracic Vascular Surgery)

## 2023-11-30 VITALS — BP 113/51 | HR 63 | Resp 18 | Ht 70.0 in | Wt 178.0 lb

## 2023-11-30 DIAGNOSIS — R0602 Shortness of breath: Secondary | ICD-10-CM | POA: Diagnosis not present

## 2023-11-30 DIAGNOSIS — Z9889 Other specified postprocedural states: Secondary | ICD-10-CM

## 2023-11-30 DIAGNOSIS — J90 Pleural effusion, not elsewhere classified: Secondary | ICD-10-CM | POA: Diagnosis not present

## 2023-11-30 DIAGNOSIS — J9811 Atelectasis: Secondary | ICD-10-CM | POA: Diagnosis not present

## 2023-11-30 MED ORDER — POTASSIUM CHLORIDE CRYS ER 20 MEQ PO TBCR
20.0000 meq | EXTENDED_RELEASE_TABLET | Freq: Every day | ORAL | 0 refills | Status: DC
Start: 2023-11-30 — End: 2023-12-07

## 2023-11-30 MED ORDER — FUROSEMIDE 40 MG PO TABS
40.0000 mg | ORAL_TABLET | Freq: Every day | ORAL | 0 refills | Status: DC
Start: 2023-11-30 — End: 2023-12-07

## 2023-12-02 DIAGNOSIS — Z9889 Other specified postprocedural states: Secondary | ICD-10-CM | POA: Diagnosis not present

## 2023-12-02 DIAGNOSIS — Z09 Encounter for follow-up examination after completed treatment for conditions other than malignant neoplasm: Secondary | ICD-10-CM | POA: Diagnosis not present

## 2023-12-02 DIAGNOSIS — I10 Essential (primary) hypertension: Secondary | ICD-10-CM | POA: Diagnosis not present

## 2023-12-02 NOTE — Progress Notes (Unsigned)
 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  Past Medical History    Past Medical History:  Diagnosis Date   Aortic regurgitation    Ascending aortic aneurysm (HCC)    Cold sweat    Elevated PSA    Gout    Hypercholesterolemia    Hypertension    Lightheaded    Nauseous    Past Surgical History:  Procedure Laterality Date   BIOPSY PHARYNX  2013   HERNIA REPAIR  2016   INTRAOPERATIVE TRANSESOPHAGEAL ECHOCARDIOGRAM N/A 11/17/2023   Procedure: ECHOCARDIOGRAM, TRANSESOPHAGEAL, INTRAOPERATIVE;  Surgeon: Eugenio Hoes, MD;  Location: [facility] OR;  Service: Open Heart Surgery;  Laterality: N/A;   REPAIR OF ACUTE ASCENDING THORACIC AORTIC DISSECTION N/A 11/17/2023   Procedure: REPAIR OF ASCENDING AORTIC DISSECTION USING A 23mm KONECT RESILIA AORTIC VALVE CONDUIT AND 32X10MM X 50CM HEMASHIELD PLATINUM GRAFT.;  Surgeon: Eugenio Hoes, MD;  Location: [facility] OR;  Service: Vascular;  Laterality: N/A;    Allergies  No Known Allergies  History of Present Illness    Kristopher Flores has a PMH of ascending aortic aneurysm status post emergency Bentall on 11/17/2023, HLD, and HTN.  Postoperatively he was noted to have thrombocytopenia which resolved.  He was also noted to have atrial fibrillation with RVR.  He received IV amiodarone.  He was discharged in stable condition on 11/24/2023.  He followed up with CVTS on 11/30/2023.  He reported low energy.  He was noticing some shortness of breath with exertion.  This was unchanged since his discharge from the hospital.  He noted some slight chest soreness.  He was requiring oxycodone occasionally for nighttime pain.  He denied dizziness falls and no LOC changes.   He noted the previous day he had elevated heart rate while lying down.  His symptoms resolved quickly.  He denied other palpitations.  He was walking several times per day for short distances.  He noted that he was living by himself.  His partner has dementia and was staying with family.  His family had been checking in with him.  He was taking sponge baths and was using a shower chair at home.  He was noted to have small bilateral pleural effusions on chest x-ray.  His left basilar atelectasis was improving.  He was encouraged to take 1 more week of Lasix and potassium.  He was instructed to continue with I-S.  His p.o. amiodarone was continued.  He was felt to be in sinus rhythm and his rate was regular.  Follow-up was planned for 2-3 weeks.  He presents to the clinic today for follow-up evaluation and states he is feeling better each day.  He notes 1 episode of palpitations that lasted for minutes and did not return.  We reviewed his surgery.  He expressed understanding.  We reviewed his cholesterol and medications.  I will order a 30-day cardiac event monitor and plan follow-up in 3 months.  Today he denies chest pain, shortness of breath, lower extremity edema, fatigue, palpitations, melena, hematuria, hemoptysis, diaphoresis, weakness, presyncope, syncope, orthopnea, and PND.    Home Medications    Prior to Admission medications   Medication Sig Start Date End Date Taking? Authorizing Provider  acetaminophen (TYLENOL) 500  MG tablet Take 500 mg by mouth every 8 (eight) hours as needed for mild pain (pain score 1-3) or moderate pain (pain score 4-6).    [provider]  allopurinol (ZYLOPRIM) 100 MG tablet Take 100 mg by mouth daily.    [provider]  amiodarone (PACERONE) 200 MG tablet Take 2 tablets (400 mg total) by mouth 2 (two) times daily for 2 days, THEN 1 tablet (200 mg total) 2 (two) times daily for 7 days, THEN 1 tablet (200 mg total) daily. 11/24/23 01/02/24  Ardelle Balls, PA-C  aspirin EC 325 MG tablet Take 1 tablet (325 mg total) by mouth daily. 11/24/23   Ardelle Balls, PA-C  furosemide (LASIX) 40 MG tablet Take 1 tablet (40 mg total) by mouth daily. Take for 7 days then stop 11/30/23   Ronney Lion, Fredric Mare C, PA-C  metoprolol tartrate (LOPRESSOR) 25 MG tablet Take 1 tablet (25 mg total) by mouth 2 (two) times daily. 11/24/23   Ardelle Balls, PA-C  Multiple Vitamin (MULTIVITAMIN) tablet Take 1 tablet by mouth daily.    [provider]  oxyCODONE (OXY IR/ROXICODONE) 5 MG immediate release tablet Take 1 tablet (5 mg total) by mouth every 6 (six) hours as needed for severe pain (pain score 7-10). 11/24/23   Doree Fudge M, PA-C  potassium chloride SA (KLOR-CON M) 20 MEQ tablet Take 1 tablet (20 mEq total) by mouth daily. Take for 7 days then stop 11/30/23   Stehler, Fredric Mare C, PA-C  rosuvastatin (CRESTOR) 40 MG tablet Take 1 tablet (40 mg total) by mouth daily. 11/24/23   Ardelle Balls, PA-C  traZODone (DESYREL) 50 MG tablet Take 50 mg by mouth at bedtime as needed for sleep.    [provider]    Family History    Family History  Problem Relation Age of Onset   Osteoarthritis Mother    Heart attack Mother 72   CVA Father    CVA Maternal Grandmother    CVA Maternal Grandfather    CVA Paternal Grandfather    Heart disease Paternal Grandfather    He indicated that his mother is deceased. He indicated that his father is deceased. He indicated that his maternal grandmother is deceased. He indicated that his maternal grandfather is deceased. He indicated that his paternal grandmother is deceased. He indicated that his paternal grandfather is deceased.  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)  Tobacco Use   Smoking status: Never   Smokeless tobacco: Never  Vaping Use   Vaping status: Never Used  Substance and Sexual Activity   Alcohol use: Never   Drug use: Never   Sexual activity: Not Currently  Other Topics Concern   Not on file  Social History Narrative   Not on file   Social Drivers of Health   Financial Resource Strain: Not on file  Food Insecurity: No Food Insecurity (11/18/2023)   Hunger Vital Sign    Worried About Running Out of Food in the Last Year: Never true    Ran Out of Food in the Last Year: Never true  Transportation Needs: No Transportation Needs (11/18/2023)   PRAPARE -  Administrator, Civil Service (Medical): No    Lack of Transportation (Non-Medical): No  Physical Activity: Not on file  Stress: Not on file  Social Connections: Moderately Isolated (11/18/2023)   Social Connection and Isolation Panel [NHANES]    Frequency of Communication with Friends and Family: More than three times a week    Frequency of Social Gatherings with Friends and Family: Once a week    Attends Religious Services: More than 4 times per year    Active Member of [employer] or Organizations: No    Attends Banker Meetings: Never    Marital Status: Divorced  Catering manager Violence: Not At Risk (11/18/2023)   Humiliation, Afraid, Rape, and Kick questionnaire    Fear of Current or Ex-Partner: No    Emotionally Abused: No    Physically Abused: No    Sexually Abused: No     Review of Systems    General:  No chills, fever, night sweats or weight changes.  Cardiovascular:  No chest pain, dyspnea on exertion, edema, orthopnea, palpitations, paroxysmal nocturnal dyspnea. Dermatological: No rash, lesions/masses Respiratory: No cough, dyspnea Urologic: No hematuria, dysuria Abdominal:   No nausea, vomiting, diarrhea, bright red blood per rectum, melena, or hematemesis Neurologic:   No visual changes, wkns, changes in mental status. All other systems reviewed and are otherwise negative except as noted above.  Physical Exam    VS:  BP 114/68 (BP Location: Left Arm, Patient Position: Sitting, Cuff Size: Normal)   Pulse 63   Ht 5\' 10"  (1.778 m)   Wt 172 lb (78 kg)   BMI 24.68 kg/m  , BMI Body mass index is 24.68 kg/m. GEN: Well nourished, well developed, in no acute distress. HEENT: normal. Neck: Supple, no JVD, carotid bruits, or masses. Cardiac: RRR, no murmurs, rubs, or gallops. No clubbing, cyanosis, edema.  Radials/DP/PT 2+ and equal bilaterally.  Respiratory:  Respirations regular and unlabored, clear to auscultation bilaterally. GI: Soft, nontender, nondistended, BS + x 4. MS: no deformity or atrophy. Skin: warm and dry, no rash.  Sternal incision and chest tube sites healing well with no signs of infection. Neuro:  Strength and sensation are intact. Psych: Normal affect.  Accessory Clinical Findings    Recent Labs: 06/09/2023: ALT 11 11/22/2023: Hemoglobin 7.6; Platelets 227 11/24/2023: BUN 20; Creatinine, Ser 1.14; Magnesium 2.1; Potassium 4.3; Sodium 138   Recent Lipid Panel    Component Value Date/Time   CHOL 175 06/09/2023 0908   TRIG 108 06/09/2023 0908   HDL 62 06/09/2023 0908   CHOLHDL 2.8 06/09/2023 0908   LDLCALC 94 06/09/2023 0908         ECG personally reviewed by me today- EKG Interpretation Date/Time:  Wednesday December 07 2023 11:01:58 EDT Ventricular Rate:  63 PR Interval:  202 QRS Duration:  88 QT Interval:  446 QTC Calculation: 456 R Axis:   43  Text Interpretation: Normal sinus rhythm Nonspecific T wave abnormality When compared with ECG of 23-Nov-2023 08:00, Sinus rhythm has replaced Atrial flutter Vent. rate has decreased BY  61 BPM Confirmed by Edd Fabian [phone]) on 12/07/2023 11:04:33 AM    Echocardiogram 01/08/2022  IMPRESSIONS     1. Left ventricular ejection fraction, by estimation, is 60 to 65%. Left   ventricular ejection fraction by 3D volume is 62 %. The left ventricle has  normal function. The left ventricle has no regional wall motion  abnormalities. There is mild concentric  left ventricular hypertrophy. Left ventricular diastolic parameters  are  consistent with Grade I diastolic dysfunction (impaired relaxation). The  average left ventricular global longitudinal strain is -23.3 %. The global  longitudinal strain is normal.   2. Right ventricular systolic function is normal. The right ventricular  size is normal. There is normal pulmonary artery systolic pressure. The  estimated right ventricular systolic pressure is 28.4 mmHg.   3. The mitral valve is normal in structure. No evidence of mitral valve  regurgitation. No evidence of mitral stenosis.   4. The aortic valve is tricuspid. There is moderate calcification of the  aortic valve. There is mild thickening of the aortic valve. Aortic valve  regurgitation is moderate, but eccentric. Aortic valve sclerosis is  present, with no evidence of aortic  valve stenosis. There is no diastolic flow reversal.   5. Aortic dilatation noted. There is severe dilatation of the ascending  aorta, measuring 54 mm.   6. The inferior vena cava is dilated in size with >50% respiratory  variability, suggesting right atrial pressure of 8 mmHg.   Comparison(s): Prior images reviewed side by side. 06/10/20 EF 60-65%.  Moderate AI. Ascending aorta 52mm. Slight increase in ascend aortic size  with no change in LV size or function.   FINDINGS   Left Ventricle: Left ventricular ejection fraction, by estimation, is 60  to 65%. Left ventricular ejection fraction by 3D volume is 62 %. The left  ventricle has normal function. The left ventricle has no regional wall  motion abnormalities. The average  left ventricular global longitudinal strain is -23.3 %. The global  longitudinal strain is normal. The left ventricular internal cavity size  was normal in  size. There is mild concentric left ventricular hypertrophy.  Left ventricular diastolic parameters  are consistent with Grade I diastolic dysfunction (impaired relaxation).   Right Ventricle: The right ventricular size is normal. No increase in  right ventricular wall thickness. Right ventricular systolic function is  normal. There is normal pulmonary artery systolic pressure. The tricuspid  regurgitant velocity is 2.26 m/s, and   with an assumed right atrial pressure of 8 mmHg, the estimated right  ventricular systolic pressure is 28.4 mmHg.   Left Atrium: Left atrial size was normal in size.   Right Atrium: Right atrial size was normal in size.   Pericardium: There is no evidence of pericardial effusion.   Mitral Valve: The mitral valve is normal in structure. No evidence of  mitral valve regurgitation. No evidence of mitral valve stenosis.   Tricuspid Valve: The tricuspid valve is normal in structure. Tricuspid  valve regurgitation is trivial. No evidence of tricuspid stenosis.   Aortic Valve: The aortic valve is tricuspid. There is moderate  calcification of the aortic valve. There is mild thickening of the aortic  valve. Aortic valve regurgitation is moderate. Aortic regurgitation PHT  measures 333 msec. Aortic valve sclerosis is  present, with no evidence of aortic valve stenosis.   Pulmonic Valve: The pulmonic valve was normal in structure. Pulmonic valve  regurgitation is not visualized. No evidence of pulmonic stenosis.   Aorta: Aortic dilatation noted. There is severe dilatation of the  ascending aorta, measuring 54 mm.   Venous: The inferior vena cava is dilated in size with greater than 50%  respiratory variability, suggesting right atrial pressure of 8 mmHg.   IAS/Shunts: No atrial level shunt detected by color flow Doppler.      Assessment & Plan   1.  Ascending aortic aneurysm-status post Bentall procedure 11/17/2023.  Continues to progress  physical  activity.  Continues to use walker at this time.  Reports breathing has improved. Maintain sternal precautions Following with TC TS  Atrial fibrillation with RVR-noted postoperatively.  EKG today shows sinus rhythm 63 bpm. Continue metoprolol, amlodipine Avoid triggers caffeine, chocolate, EtOH, dehydration etc. Order 30-day cardiac event monitor  Essential hypertension-BP today 114/68 Maintain blood pressure log Heart healthy low-sodium diet  Hyperlipidemia-LDL 94 . High-fiber diet Continue rosuvastatin Repeat fasting lipids and lfts at follow up  Disposition: Follow-up with Dr. Flora Lipps or me in 3-4 months.   Thomasene Ripple. Shamari Lofquist NP-C     12/07/2023, 11:19 AM [facility] [address] Office [phone] Fax [phone]    I spent 14 minutes examining this patient, reviewing medications, and using patient centered shared decision making involving their cardiac care.   I spent  20 minutes reviewing past medical history,  medications, and prior cardiac tests.

## 2023-12-03 ENCOUNTER — Other Ambulatory Visit: Payer: Self-pay | Admitting: Physician Assistant

## 2023-12-05 ENCOUNTER — Encounter (HOSPITAL_BASED_OUTPATIENT_CLINIC_OR_DEPARTMENT_OTHER): Payer: Self-pay

## 2023-12-05 ENCOUNTER — Other Ambulatory Visit: Payer: Self-pay

## 2023-12-05 MED ORDER — ROSUVASTATIN CALCIUM 40 MG PO TABS: 40.0000 mg | ORAL_TABLET | Freq: Every day | ORAL | 2 refills | Status: AC

## 2023-12-07 ENCOUNTER — Encounter: Payer: Self-pay | Admitting: General Practice

## 2023-12-07 ENCOUNTER — Ambulatory Visit: Attending: General Practice | Admitting: General Practice

## 2023-12-07 ENCOUNTER — Encounter: Payer: Self-pay | Admitting: *Deleted

## 2023-12-07 VITALS — BP 114/68 | HR 63 | Ht 70.0 in | Wt 172.0 lb

## 2023-12-07 DIAGNOSIS — Z9889 Other specified postprocedural states: Secondary | ICD-10-CM | POA: Diagnosis not present

## 2023-12-07 DIAGNOSIS — E782 Mixed hyperlipidemia: Secondary | ICD-10-CM

## 2023-12-07 DIAGNOSIS — I4891 Unspecified atrial fibrillation: Secondary | ICD-10-CM | POA: Diagnosis not present

## 2023-12-07 DIAGNOSIS — I10 Essential (primary) hypertension: Secondary | ICD-10-CM

## 2023-12-07 NOTE — Patient Instructions (Signed)
 Medication Instructions:  The current medical regimen is effective;  continue present plan and medications as directed. Please refer to the Current Medication list given to you today.  *If you need a refill on your cardiac medications before your next appointment, please call your pharmacy*  Lab Work: NONE  Testing/Procedures: SEE ATTACHED 30 DAY MONITOR INSTRUCTIONS  Follow-Up: At [facility], you and your health needs are our priority.  As part of our continuing mission to provide you with exceptional heart care, our providers are all part of one team.  This team includes your primary Cardiologist (physician) and Advanced Practice Providers or APPs (Physician Assistants and Nurse Practitioners) who all work together to provide you with the care you need, when you need it.  Your next appointment:   3-4 month(s)  Provider:   Reatha Harps, MD or Edd Fabian, NP          We recommend signing up for the patient portal called "MyChart".  Sign up information is provided on this After Visit Summary.  MyChart is used to connect with patients for Virtual Visits (Telemedicine).  Patients are able to view lab/test results, encounter notes, upcoming appointments, etc.  Non-urgent messages can be sent to your provider as well.   To learn more about what you can do with MyChart, go to [email or web address].   Other Instructions MAINTAIN STERNAL PRECAUTIONS NO DRIVING UNTIL CLEARED THRU YOUR CARDIO-THORACIC SURGEON   [employer] Cardiac Event Monitor Instructions  Your physician has requested you wear your cardiac event monitor for __30___ days, (1-30). [employer] may call or text to confirm a shipping address. The monitor will be sent to a land address via UPS. [employer] will not ship a monitor to a PO BOX. It typically takes 3-5 days to receive your monitor after it has been enrolled. [employer] will assist with USPS tracking if your package is delayed. The telephone number  for [employer] is [phone]. Once you have received your monitor, please review the enclosed instructions. Instruction tutorials can also be viewed under help and settings on the enclosed cell phone. Your monitor has already been registered assigning a specific monitor serial # to you.  Billing and Self Pay Discount Information  [employer] has been provided the insurance information we had on file for you.  If your insurance has been updated, please call [employer] at [phone] to provide them with your updated insurance information.   [employer] offers a discounted Self Pay option for patients who have insurance that does not cover their cardiac event monitor or patients without insurance.  The discounted cost of a Self Pay Cardiac Event Monitor would be $225.00 , if the patient contacts [employer] at [phone] within 7 days of applying the monitor to make payment arrangements.  If the patient does not contact [employer] within 7 days of applying the monitor, the cost of the cardiac event monitor will be $350.00.  Applying the monitor  Remove cell phone from case and turn it on. The cell phone works as IT consultant and needs to be within [employer] of you at all times. The cell phone will need to be charged on a daily basis. We recommend you plug the cell phone into the enclosed charger at your bedside table every night.  Monitor batteries: You will receive two monitor batteries labelled #1 and #2. These are your recorders. Plug battery #2 onto the second connection on the enclosed charger. Keep one battery on the charger at all times. This will keep the monitor  battery deactivated. It will also keep it fully charged for when you need to switch your monitor batteries. A small light will be blinking on the battery emblem when it is charging. The light on the battery emblem will remain on when the battery is fully charged.  Open package of a Monitor strip. Insert battery #1 into  black hood on strip and gently squeeze monitor battery onto connection as indicated in instruction booklet. Set aside while preparing skin.  Choose location for your strip, vertical or horizontal, as indicated in the instruction booklet. Shave to remove all hair from location. There cannot be any lotions, oils, powders, or colognes on skin where monitor is to be applied. Wipe skin clean with enclosed Saline wipe. Dry skin completely.  Peel paper labeled #1 off the back of the Monitor strip exposing the adhesive. Place the monitor on the chest in the vertical or horizontal position shown in the instruction booklet. One arrow on the monitor strip must be pointing upward. Carefully remove paper labeled #2, attaching remainder of strip to your skin. Try not to create any folds or wrinkles in the strip as you apply it.  Firmly press and release the circle in the center of the monitor battery. You will hear a small beep. This is turning the monitor battery on. The heart emblem on the monitor battery will light up every 5 seconds if the monitor battery in turned on and connected to the patient securely. Do not push and hold the circle down as this turns the monitor battery off. The cell phone will locate the monitor battery. A screen will appear on the cell phone checking the connection of your monitor strip. This may read poor connection initially but change to good connection within the next minute. Once your monitor accepts the connection you will hear a series of 3 beeps followed by a climbing crescendo of beeps. A screen will appear on the cell phone showing the two monitor strip placement options. Touch the picture that demonstrates where you applied the monitor strip.  Your monitor strip and battery are waterproof. You are able to shower, bathe, or swim with the monitor on. They just ask you do not submerge deeper than 3 feet underwater. We recommend removing the monitor if you are swimming in  a lake, river, or ocean.  Your monitor battery will need to be switched to a fully charged monitor battery approximately once a week. The cell phone will alert you of an action which needs to be made.  On the cell phone, tap for details to reveal connection status, monitor battery status, and cell phone battery status. The green dots indicates your monitor is in good status. A red dot indicates there is something that needs your attention.  To record a symptom, click the circle on the monitor battery. In 30-60 seconds a list of symptoms will appear on the cell phone. Select your symptom and tap save. Your monitor will record a sustained or significant arrhythmia regardless of you clicking the button. Some patients do not feel the heart rhythm irregularities. [employer] will notify us of any serious or critical events.  Refer to instruction booklet for instructions on switching batteries, changing strips, the Do not disturb or Pause features, or any additional questions.  Call [employer] at [phone], to confirm your monitor is transmitting and record your baseline. They will answer any questions you may have regarding the monitor instructions at that time.  Returning the monitor to [employer]  Place all  equipment back into blue box. Peel off strip of paper to expose adhesive and close box securely. There is a prepaid UPS shipping label on this box. Drop in a UPS drop box, or at a UPS facility like [employer]. You may also contact [employer] to arrange UPS to pick up monitor package at your home.       1st Floor: - Lobby - Registration  - Pharmacy  - Lab - Cafe  2nd Floor: - PV Lab - Diagnostic Testing (echo, CT, nuclear med)  3rd Floor: - Vacant  4th Floor: - TCTS (cardiothoracic surgery) - AFib Clinic - Structural Heart Clinic - Vascular Surgery  - Vascular Ultrasound  5th Floor: - HeartCare Cardiology (general and EP) - Clinical Pharmacy for coumadin,  hypertension, lipid, weight-loss medications, and med management appointments    Valet parking services will be available as well.

## 2023-12-07 NOTE — Progress Notes (Unsigned)
 Patient enrolled for [employer]/ [employer] to ship a 30 day cardiac event monitor to his address on file.  Dr. Flora Lipps to read.

## 2023-12-13 ENCOUNTER — Other Ambulatory Visit: Payer: Self-pay | Admitting: Thoracic Surgery (Cardiothoracic Vascular Surgery)

## 2023-12-15 ENCOUNTER — Other Ambulatory Visit: Payer: Self-pay | Admitting: Thoracic Surgery (Cardiothoracic Vascular Surgery)

## 2023-12-15 DIAGNOSIS — Z9889 Other specified postprocedural states: Secondary | ICD-10-CM

## 2023-12-22 ENCOUNTER — Encounter: Payer: Self-pay | Admitting: Thoracic Surgery (Cardiothoracic Vascular Surgery)

## 2023-12-22 ENCOUNTER — Ambulatory Visit (INDEPENDENT_AMBULATORY_CARE_PROVIDER_SITE_OTHER): Payer: Self-pay | Admitting: Thoracic Surgery (Cardiothoracic Vascular Surgery)

## 2023-12-22 ENCOUNTER — Other Ambulatory Visit: Payer: Self-pay

## 2023-12-22 ENCOUNTER — Ambulatory Visit
Admission: RE | Admit: 2023-12-22 | Discharge: 2023-12-22 | Disposition: A | Discharge status: Home / Self Care | Source: Ambulatory Visit | Attending: Thoracic Surgery (Cardiothoracic Vascular Surgery) | Admitting: Thoracic Surgery (Cardiothoracic Vascular Surgery)

## 2023-12-22 VITALS — BP 118/63 | HR 60 | Resp 20 | Ht 70.0 in | Wt 172.0 lb

## 2023-12-22 DIAGNOSIS — Z9889 Other specified postprocedural states: Secondary | ICD-10-CM

## 2023-12-22 DIAGNOSIS — J90 Pleural effusion, not elsewhere classified: Secondary | ICD-10-CM | POA: Diagnosis not present

## 2023-12-22 DIAGNOSIS — Z952 Presence of prosthetic heart valve: Secondary | ICD-10-CM | POA: Diagnosis not present

## 2023-12-22 MED ORDER — ASPIRIN 81 MG PO TBEC: 81.0000 mg | DELAYED_RELEASE_TABLET | Freq: Every day | ORAL | 2 refills | Status: AC

## 2023-12-22 NOTE — Progress Notes (Signed)
 [address]       [place] [address]             [phone]           Jovin Fester [facility] Medical Record #[id number] Date of Birth: [birth year]  Sande Rives, Soundra Pilon, FNP  Chief Complaint:  sp aortic dissection repair with added Bental procedure   History of Present Illness:     Pt is just about 5 weeks post above surgery and slowly progressing. He feels his energy level getting better daily. Now ambulating without walker. Has not heard from cardiac rehab. Wants to begin golfing in the next several weeks      Past Medical History:  Diagnosis Date   Aortic regurgitation    Ascending aortic aneurysm (HCC)    Cold sweat    Elevated PSA    Gout    Hypercholesterolemia    Hypertension    Lightheaded    Nauseous     Past Surgical History:  Procedure Laterality Date   BIOPSY PHARYNX  2013   HERNIA REPAIR  2016   INTRAOPERATIVE TRANSESOPHAGEAL ECHOCARDIOGRAM N/A 11/17/2023   Procedure: ECHOCARDIOGRAM, TRANSESOPHAGEAL, INTRAOPERATIVE;  Surgeon: Eugenio Hoes, MD;  Location: [facility] OR;  Service: Open Heart Surgery;  Laterality: N/A;   REPAIR OF ACUTE ASCENDING THORACIC AORTIC DISSECTION N/A 11/17/2023   Procedure: REPAIR OF ASCENDING AORTIC DISSECTION USING A 23mm KONECT RESILIA AORTIC VALVE CONDUIT AND 32X10MM X 50CM HEMASHIELD PLATINUM GRAFT.;  Surgeon: Eugenio Hoes, MD;  Location: [facility] OR;  Service: Vascular;  Laterality: N/A;    Social History   Tobacco Use  Smoking Status Never  Smokeless Tobacco Never    Social History   Substance and Sexual Activity  Alcohol Use Never    Social History   Socioeconomic History   Marital status: Married    Spouse name: Not on file   Number of children: 0   Years of education: Not on file   Highest education level: Not on file  Occupational History   Occupation: [employer]    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)  Tobacco Use   Smoking status:  Never   Smokeless tobacco: Never  Vaping Use   Vaping status: Never Used  Substance and Sexual Activity   Alcohol use: Never   Drug use: Never   Sexual activity: Not Currently  Other Topics Concern   Not on file  Social History Narrative   Not on file   Social Drivers of Health   Financial Resource Strain: Not on file  Food Insecurity: No Food Insecurity (11/18/2023)   Hunger Vital Sign    Worried About Running Out of Food in the Last Year: Never true    Ran Out of Food in the Last Year: Never true  Transportation Needs: No Transportation Needs (11/18/2023)   PRAPARE - Administrator, Civil Service (Medical): No    Lack of Transportation (Non-Medical): No  Physical Activity: Not on file  Stress: Not on file  Social Connections: Moderately Isolated (11/18/2023)   Social Connection and Isolation Panel [NHANES]    Frequency of Communication with Friends and Family: More than three times a week    Frequency of Social Gatherings with Friends and Family: Once a week    Attends Religious Services: More than 4 times per year    Active Member of [employer] or Organizations: No    Attends Banker Meetings: Never  Marital Status: Divorced  Catering manager Violence: Not At Risk (11/18/2023)   Humiliation, Afraid, Rape, and Kick questionnaire    Fear of Current or Ex-Partner: No    Emotionally Abused: No    Physically Abused: No    Sexually Abused: No    No Known Allergies  Current Outpatient Medications  Medication Sig Dispense Refill   acetaminophen (TYLENOL) 500 MG tablet Take 500 mg by mouth every 8 (eight) hours as needed for mild pain (pain score 1-3) or moderate pain (pain score 4-6).     allopurinol (ZYLOPRIM) 100 MG tablet Take 100 mg by mouth daily.     amiodarone (PACERONE) 200 MG tablet Take 2 tablets (400 mg total) by mouth 2 (two) times daily for 2 days, THEN 1 tablet (200 mg total) 2 (two) times daily for 7 days, THEN 1 tablet (200 mg total) daily.  120 tablet 1   aspirin EC 325 MG tablet Take 1 tablet (325 mg total) by mouth daily.     metoprolol tartrate (LOPRESSOR) 25 MG tablet Take 1 tablet (25 mg total) by mouth 2 (two) times daily. 60 tablet 1   Multiple Vitamin (MULTIVITAMIN) tablet Take 1 tablet by mouth daily.     rosuvastatin (CRESTOR) 40 MG tablet Take 1 tablet (40 mg total) by mouth daily. 90 tablet 2   traZODone (DESYREL) 100 MG tablet Take 100 mg by mouth at bedtime as needed for sleep.     No current facility-administered medications for this visit.     Family History  Problem Relation Age of Onset   Osteoarthritis Mother    Heart attack Mother 64   CVA Father    CVA Maternal Grandmother    CVA Maternal Grandfather    CVA Paternal Grandfather    Heart disease Paternal Grandfather        Physical Exam: Lungs: clear Card: RR with soft murmur Ext: no edema Incision well healed Neuro Intact     Diagnostic Studies & Laboratory data: I have personally reviewed the following studies and agree with the findings     Recent Radiology Findings:   CXR today with nearly completely clear lung fields    Recent Lab Findings: Lab Results  Component Value Date   WBC 13.4 (H) 11/22/2023   HGB 7.6 (L) 11/22/2023   HCT 22.7 (L) 11/22/2023   PLT 227 11/22/2023   GLUCOSE 94 11/24/2023   CHOL 175 06/09/2023   TRIG 108 06/09/2023   HDL 62 06/09/2023   LDLCALC 94 06/09/2023   ALT 11 06/09/2023   AST 18 06/09/2023   NA 138 11/24/2023   K 4.3 11/24/2023   CL 105 11/24/2023   CREATININE 1.14 11/24/2023   BUN 20 11/24/2023   CO2 24 11/24/2023   INR 1.2 11/19/2023   HGBA1C 5.0 11/19/2023      Assessment / Plan:     SP Dissection repair and Bental procedure. Will stop amiodarone and drop ASA to 81mg  a day. To have echo in a few weeks. Restrictions reviewed. Will have go to cardiac rehab   I have spent 30 min in review of the records, viewing studies and in face to face with patient and in coordination of  future care    Eugenio Hoes 12/22/2023 8:21 AM

## 2023-12-22 NOTE — Patient Instructions (Signed)
 Stop amiodarone ASA 81mg  a day Follow up prn

## 2023-12-22 NOTE — Progress Notes (Signed)
 Referral placed per Dr. Verdene Givens request to outpatient cardiac rehab at [facility].

## 2023-12-27 ENCOUNTER — Ambulatory Visit (HOSPITAL_COMMUNITY): Attending: Cardiovascular Disease

## 2023-12-27 DIAGNOSIS — E785 Hyperlipidemia, unspecified: Secondary | ICD-10-CM | POA: Diagnosis not present

## 2023-12-27 DIAGNOSIS — R42 Dizziness and giddiness: Secondary | ICD-10-CM | POA: Insufficient documentation

## 2023-12-27 DIAGNOSIS — I7101 Dissection of ascending aorta: Secondary | ICD-10-CM

## 2023-12-27 DIAGNOSIS — Z8249 Family history of ischemic heart disease and other diseases of the circulatory system: Secondary | ICD-10-CM | POA: Insufficient documentation

## 2023-12-27 DIAGNOSIS — Z952 Presence of prosthetic heart valve: Secondary | ICD-10-CM | POA: Insufficient documentation

## 2023-12-27 DIAGNOSIS — Z95828 Presence of other vascular implants and grafts: Secondary | ICD-10-CM | POA: Diagnosis not present

## 2023-12-27 DIAGNOSIS — I10 Essential (primary) hypertension: Secondary | ICD-10-CM | POA: Diagnosis not present

## 2023-12-27 DIAGNOSIS — Z9889 Other specified postprocedural states: Secondary | ICD-10-CM | POA: Insufficient documentation

## 2023-12-27 DIAGNOSIS — Z48812 Encounter for surgical aftercare following surgery on the circulatory system: Secondary | ICD-10-CM | POA: Insufficient documentation

## 2023-12-27 DIAGNOSIS — I71019 Dissection of thoracic aorta, unspecified: Secondary | ICD-10-CM | POA: Diagnosis not present

## 2023-12-27 LAB — ECHOCARDIOGRAM COMPLETE
AR max vel: 1.68 cm2
AV Area VTI: 1.74 cm2
AV Area mean vel: 1.64 cm2
AV Mean grad: 6.5 mmHg
AV Peak grad: 12.1 mmHg
Ao pk vel: 1.74 m/s
Area-P 1/2: 4.78 cm2
S' Lateral: 2.8 cm

## 2023-12-28 ENCOUNTER — Encounter: Payer: Self-pay | Admitting: Cardiovascular Disease

## 2023-12-30 LAB — ECHO INTRAOPERATIVE TEE
AV Mean grad: 5 mmHg
AV Peak grad: 11.2 mmHg
AV Vena cont: 0.33 cm
Ao pk vel: 1.67 m/s
Height: 70 in
MV Vena cont: 0.2 cm
Weight: 2880 [oz_av]

## 2024-01-25 DIAGNOSIS — J189 Pneumonia, unspecified organism: Secondary | ICD-10-CM | POA: Diagnosis not present

## 2024-01-25 DIAGNOSIS — I10 Essential (primary) hypertension: Secondary | ICD-10-CM | POA: Diagnosis not present

## 2024-01-25 DIAGNOSIS — Z6825 Body mass index (BMI) 25.0-25.9, adult: Secondary | ICD-10-CM | POA: Diagnosis not present

## 2024-03-14 DIAGNOSIS — Z8249 Family history of ischemic heart disease and other diseases of the circulatory system: Secondary | ICD-10-CM | POA: Diagnosis not present

## 2024-03-14 DIAGNOSIS — Z008 Encounter for other general examination: Secondary | ICD-10-CM | POA: Diagnosis not present

## 2024-03-14 DIAGNOSIS — K5900 Constipation, unspecified: Secondary | ICD-10-CM | POA: Diagnosis not present

## 2024-03-14 DIAGNOSIS — R269 Unspecified abnormalities of gait and mobility: Secondary | ICD-10-CM | POA: Diagnosis not present

## 2024-03-14 DIAGNOSIS — M1990 Unspecified osteoarthritis, unspecified site: Secondary | ICD-10-CM | POA: Diagnosis not present

## 2024-03-14 DIAGNOSIS — E785 Hyperlipidemia, unspecified: Secondary | ICD-10-CM | POA: Diagnosis not present

## 2024-03-14 DIAGNOSIS — I130 Hypertensive heart and chronic kidney disease with heart failure and stage 1 through stage 4 chronic kidney disease, or unspecified chronic kidney disease: Secondary | ICD-10-CM | POA: Diagnosis not present

## 2024-03-14 DIAGNOSIS — N529 Male erectile dysfunction, unspecified: Secondary | ICD-10-CM | POA: Diagnosis not present

## 2024-03-14 DIAGNOSIS — Z7982 Long term (current) use of aspirin: Secondary | ICD-10-CM | POA: Diagnosis not present

## 2024-03-14 DIAGNOSIS — I700 Atherosclerosis of aorta: Secondary | ICD-10-CM | POA: Diagnosis not present

## 2024-03-14 DIAGNOSIS — I272 Pulmonary hypertension, unspecified: Secondary | ICD-10-CM | POA: Diagnosis not present

## 2024-03-14 DIAGNOSIS — G4700 Insomnia, unspecified: Secondary | ICD-10-CM | POA: Diagnosis not present

## 2024-03-14 DIAGNOSIS — I509 Heart failure, unspecified: Secondary | ICD-10-CM | POA: Diagnosis not present

## 2024-03-28 DIAGNOSIS — M109 Gout, unspecified: Secondary | ICD-10-CM | POA: Diagnosis not present

## 2024-03-28 DIAGNOSIS — E785 Hyperlipidemia, unspecified: Secondary | ICD-10-CM | POA: Diagnosis not present

## 2024-03-28 DIAGNOSIS — N529 Male erectile dysfunction, unspecified: Secondary | ICD-10-CM | POA: Diagnosis not present

## 2024-03-28 DIAGNOSIS — Z6825 Body mass index (BMI) 25.0-25.9, adult: Secondary | ICD-10-CM | POA: Diagnosis not present

## 2024-03-28 DIAGNOSIS — Z0000 Encounter for general adult medical examination without abnormal findings: Secondary | ICD-10-CM | POA: Diagnosis not present

## 2024-03-28 DIAGNOSIS — I5189 Other ill-defined heart diseases: Secondary | ICD-10-CM | POA: Diagnosis not present

## 2024-03-28 DIAGNOSIS — R972 Elevated prostate specific antigen [PSA]: Secondary | ICD-10-CM | POA: Diagnosis not present

## 2024-03-28 DIAGNOSIS — G629 Polyneuropathy, unspecified: Secondary | ICD-10-CM | POA: Diagnosis not present

## 2024-03-28 DIAGNOSIS — I10 Essential (primary) hypertension: Secondary | ICD-10-CM | POA: Diagnosis not present

## 2024-03-28 DIAGNOSIS — G4700 Insomnia, unspecified: Secondary | ICD-10-CM | POA: Diagnosis not present

## 2024-03-28 DIAGNOSIS — Z23 Encounter for immunization: Secondary | ICD-10-CM | POA: Diagnosis not present

## 2024-04-05 DIAGNOSIS — E785 Hyperlipidemia, unspecified: Secondary | ICD-10-CM | POA: Diagnosis not present

## 2024-04-05 DIAGNOSIS — I10 Essential (primary) hypertension: Secondary | ICD-10-CM | POA: Diagnosis not present

## 2024-04-12 DIAGNOSIS — H25813 Combined forms of age-related cataract, bilateral: Secondary | ICD-10-CM | POA: Diagnosis not present

## 2024-04-12 DIAGNOSIS — H5203 Hypermetropia, bilateral: Secondary | ICD-10-CM | POA: Diagnosis not present

## 2024-04-12 DIAGNOSIS — H52223 Regular astigmatism, bilateral: Secondary | ICD-10-CM | POA: Diagnosis not present

## 2024-04-12 DIAGNOSIS — H524 Presbyopia: Secondary | ICD-10-CM | POA: Diagnosis not present

## 2024-04-18 NOTE — Progress Notes (Signed)
 Cardiology Office Note:  .   Date:  04/19/2024  ID:  [name], DOB 22-Sep-1945, MRN [id number] PCP: Marvene Prentice SAUNDERS, FNP  [facility] Providers Cardiologist:  Darryle ONEIDA Decent, MD { History of Present Illness: .    Chief Complaint  Patient presents with   Follow-up         Kristopher Flores is a 78 y.o. male with history of aortic aneurysm/dissection, postop Afib, HTN, HLD who presents for follow-up.   History of Present Illness   Kristopher Flores is a 78 year old male with aortic aneurysm and aortic dissection who presents for follow-up after aortic valve replacement and Bentall procedure.  In March 2025, he experienced an aortic dissection, necessitating an emergency Bentall procedure and aortic valve replacement. Prior to the surgery, he experienced 'fluttering' sensations and called 911, leading to his hospitalization and subsequent surgery. Postoperatively, he developed atrial fibrillation but has had no recurrence since then.  He is currently on Crestor 40 mg daily, with his most recent LDL cholesterol level at 54 mg/dL. He is also taking aspirin. He mentions concern about swelling in a specific area, fearing it might be related to blood clots. No chest pain or difficulty breathing.  He is actively engaged in physical activity, attending the gym and performing upper body exercises and treadmill workouts. He reports no limitations in his physical activities and is careful not to overexert himself. 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  Socially, he has experienced significant changes, including the separation from his partner, Kristopher Flores, who has been diagnosed with dementia and is now living with her son. He expresses emotional distress over her condition but is trying to move forward with his life, including exploring new social connections through a dating site.           Problem List 1. HTN 2.  HLD -T chol 132, HDL 53, LDL 58, triglycerides 119 3. Ascending aortic aneurysm/Type A dissection  -s/p bentall 11/17/2023 -23 pericardial connect valve; 32 mm Hemashield graft  4. Postop Afib 5. Coronary calcifications 6. 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 Pertinent positives noted in the HPI.     Studies Reviewed: SABRA       TTE 12/27/2023  1. Left ventricular ejection fraction, by estimation, is 60 to 65%. Left  ventricular ejection fraction by 3D volume is 64 %. The left ventricle has  normal function. The left ventricle has no regional wall motion  abnormalities. There is mild concentric  left ventricular hypertrophy. Left ventricular diastolic parameters were  normal.   2. Right ventricular systolic function is mildly reduced. The right  ventricular size is normal.   3. Left atrial size was severely dilated.   4. Right atrial size was severely dilated.   5. The mitral valve is normal in structure. Trivial mitral valve  regurgitation.   6. The aortic valve has been repaired/replaced. Aortic valve  regurgitation is not visualized. No aortic stenosis is present. Aortic  valve mean gradient measures 6.5 mmHg. Aortic valve Vmax measures 1.74  m/s. Aortic valve acceleration time measures 81  msec.   7. Aortic root/ascending aorta has been repaired/replaced.   8. The inferior vena cava is normal in size with greater than 50%  respiratory variability, suggesting right atrial pressure of 3 mmHg.  Physical Exam:   VS:  BP 126/76   Pulse (!) 55  Ht 5' 10 (1.778 m)   Wt 173 lb 12.8 oz (78.8 kg)   SpO2 98%   BMI 24.94 kg/m    Wt Readings from Last 3 Encounters:  04/19/24 173 lb 12.8 oz (78.8 kg)  12/22/23 172 lb (78 kg)  12/07/23 172 lb (78 kg)    GEN: Well nourished, well developed in no acute distress NECK: No JVD; No carotid bruits CARDIAC: RRR, no murmurs, rubs, gallops RESPIRATORY:  Clear to  auscultation without rales, wheezing or rhonchi  ABDOMEN: Soft, non-tender, non-distended EXTREMITIES:  No edema; No deformity  ASSESSMENT AND PLAN: .   Assessment and Plan    Status post aortic dissection repair and aortic valve replacement Status post Bentall procedure and aortic valve replacement in March 2025 due to aortic dissection. Postoperative recovery ongoing, no atrial fibrillation recurrence. Concerns about swelling, likely related to surgical scar tissue. No chest pain or dyspnea. - Order CTA chest to evaluate aortic dissection repair. Will need every 6 months first year and the yearly after.  - Continue aspirin 81 mg daily. - Prescribe amoxicillin 2 grams for endocarditis prophylaxis before dental work due to prosthetic AVR. Stable on echo in April   Coronary artery disease with coronary calcifications Coronary artery disease with documented coronary calcifications. Well-managed, no new symptoms. Hyperlipidemia Hyperlipidemia well-controlled, LDL at 54 mg/dL. Continues on high-intensity statin therapy. - Continue Crestor 40 mg daily.   Postop Afib - no recurrence. No need for [facility]   HTN - metoprolol tartrate 25 mg BID.              Follow-up: Return in about 6 months (around 10/20/2024).  Signed, Darryle DASEN. Barbaraann, MD, [facility]  [facility]  [address] [place], [place] [address] [phone]  9:10 AM

## 2024-04-19 ENCOUNTER — Encounter: Payer: Self-pay | Admitting: Cardiovascular Disease

## 2024-04-19 ENCOUNTER — Ambulatory Visit: Attending: Cardiovascular Disease | Admitting: Cardiovascular Disease

## 2024-04-19 VITALS — BP 126/76 | HR 55 | Ht 70.0 in | Wt 173.8 lb

## 2024-04-19 DIAGNOSIS — Z01812 Encounter for preprocedural laboratory examination: Secondary | ICD-10-CM

## 2024-04-19 DIAGNOSIS — Z9889 Other specified postprocedural states: Secondary | ICD-10-CM | POA: Diagnosis not present

## 2024-04-19 DIAGNOSIS — I10 Essential (primary) hypertension: Secondary | ICD-10-CM

## 2024-04-19 DIAGNOSIS — I2510 Atherosclerotic heart disease of native coronary artery without angina pectoris: Secondary | ICD-10-CM | POA: Diagnosis not present

## 2024-04-19 DIAGNOSIS — E782 Mixed hyperlipidemia: Secondary | ICD-10-CM | POA: Diagnosis not present

## 2024-04-19 MED ORDER — AMOXICILLIN 500 MG PO CAPS
2000.0000 mg | ORAL_CAPSULE | ORAL | 3 refills | Status: AC
Start: 2024-04-19 — End: ?

## 2024-04-19 NOTE — Patient Instructions (Signed)
 Medication Instructions:  The current medical regimen is effective;  continue present plan and medications.  *If you need a refill on your cardiac medications before your next appointment, please call your pharmacy*  Lab Work: Please have blood work today  (BMP) If you have labs (blood work) drawn today and your tests are completely normal, you will receive your results only by: MyChart Message (if you have MyChart) OR A paper copy in the mail If you have any lab test that is abnormal or we need to change your treatment, we will call you to review the results.  Testing/Procedures: CTA of chest to follow up aortic dissection repair. You will be contacted to be scheduled for this testing.  Follow-Up: At [facility], you and your health needs are our priority.  As part of our continuing mission to provide you with exceptional heart care, our providers are all part of one team.  This team includes your primary Cardiologist (physician) and Advanced Practice Providers or APPs (Physician Assistants and Nurse Practitioners) who all work together to provide you with the care you need, when you need it.  Your next appointment:   6 month(s)  Provider:   Darryle ONEIDA Decent, MD    We recommend signing up for the patient portal called MyChart.  Sign up information is provided on this After Visit Summary.  MyChart is used to connect with patients for Virtual Visits (Telemedicine).  Patients are able to view lab/test results, encounter notes, upcoming appointments, etc.  Non-urgent messages can be sent to your provider as well.   To learn more about what you can do with MyChart, go to [email or web address].

## 2024-04-20 LAB — BASIC METABOLIC PANEL WITH GFR
BUN/Creatinine Ratio: 16 (ref 10–24)
BUN: 17 mg/dL (ref 8–27)
CO2: 24 mmol/L (ref 20–29)
Calcium: 9.7 mg/dL (ref 8.6–10.2)
Chloride: 99 mmol/L (ref 96–106)
Creatinine, Ser: 1.09 mg/dL (ref 0.76–1.27)
Glucose: 91 mg/dL (ref 70–99)
Potassium: 4.6 mmol/L (ref 3.5–5.2)
Sodium: 138 mmol/L (ref 134–144)
eGFR: 69 mL/min/1.73 (ref >59)

## 2024-04-21 ENCOUNTER — Ambulatory Visit: Payer: Self-pay | Admitting: Cardiovascular Disease

## 2024-04-26 ENCOUNTER — Ambulatory Visit (HOSPITAL_COMMUNITY)
Admission: RE | Admit: 2024-04-26 | Discharge: 2024-04-26 | Disposition: A | Discharge status: Home / Self Care | Source: Ambulatory Visit | Attending: Cardiology | Admitting: Cardiology

## 2024-04-26 DIAGNOSIS — J9811 Atelectasis: Secondary | ICD-10-CM | POA: Diagnosis not present

## 2024-04-26 DIAGNOSIS — Z9889 Other specified postprocedural states: Secondary | ICD-10-CM | POA: Diagnosis not present

## 2024-04-26 MED ORDER — IOHEXOL 350 MG/ML SOLN
75.0000 mL | Freq: Once | INTRAVENOUS | Status: AC | PRN
  Administered 2024-04-26: 75 mL via INTRAVENOUS

## 2024-05-06 DIAGNOSIS — E785 Hyperlipidemia, unspecified: Secondary | ICD-10-CM | POA: Diagnosis not present

## 2024-05-06 DIAGNOSIS — I10 Essential (primary) hypertension: Secondary | ICD-10-CM | POA: Diagnosis not present

## 2024-05-23 ENCOUNTER — Encounter: Payer: Self-pay | Admitting: Urology

## 2024-06-05 DIAGNOSIS — I10 Essential (primary) hypertension: Secondary | ICD-10-CM | POA: Diagnosis not present

## 2024-06-05 DIAGNOSIS — E785 Hyperlipidemia, unspecified: Secondary | ICD-10-CM | POA: Diagnosis not present

## 2024-07-06 DIAGNOSIS — I10 Essential (primary) hypertension: Secondary | ICD-10-CM | POA: Diagnosis not present

## 2024-07-06 DIAGNOSIS — E785 Hyperlipidemia, unspecified: Secondary | ICD-10-CM | POA: Diagnosis not present

## 2024-08-05 DIAGNOSIS — E785 Hyperlipidemia, unspecified: Secondary | ICD-10-CM | POA: Diagnosis not present

## 2024-08-05 DIAGNOSIS — I10 Essential (primary) hypertension: Secondary | ICD-10-CM | POA: Diagnosis not present

## 2024-10-03 ENCOUNTER — Encounter: Payer: Self-pay | Admitting: Cardiovascular Disease

## 2024-10-03 DIAGNOSIS — Z952 Presence of prosthetic heart valve: Secondary | ICD-10-CM

## 2024-11-07 ENCOUNTER — Ambulatory Visit (HOSPITAL_COMMUNITY)
